# Patient Record
Sex: Female | Born: 1940 | Race: White | Hispanic: No | Smoking: Never smoker
Health system: Southern US, Community
[De-identification: ages and names within clinical notes are randomized; demographics above are authoritative.]

## PROBLEM LIST (undated history)

## (undated) DIAGNOSIS — E785 Hyperlipidemia, unspecified: Secondary | ICD-10-CM

## (undated) DIAGNOSIS — Z86718 Personal history of other venous thrombosis and embolism: Secondary | ICD-10-CM

## (undated) DIAGNOSIS — I10 Essential (primary) hypertension: Secondary | ICD-10-CM

## (undated) DIAGNOSIS — K5090 Crohn's disease, unspecified, without complications: Secondary | ICD-10-CM

## (undated) DIAGNOSIS — J45909 Unspecified asthma, uncomplicated: Secondary | ICD-10-CM

## (undated) HISTORY — DX: Personal history of other venous thrombosis and embolism: Z86.718

## (undated) HISTORY — DX: Crohn's disease, unspecified, without complications: K50.90

## (undated) HISTORY — PX: APPENDECTOMY: SHX54

## (undated) HISTORY — PX: TONSILLECTOMY: SUR1361

## (undated) HISTORY — DX: Hyperlipidemia, unspecified: E78.5

## (undated) HISTORY — DX: Essential (primary) hypertension: I10

## (undated) HISTORY — DX: Unspecified asthma, uncomplicated: J45.909

## (undated) HISTORY — PX: BREAST CYST ASPIRATION: SHX578

---

## 2008-10-31 DIAGNOSIS — Z86718 Personal history of other venous thrombosis and embolism: Secondary | ICD-10-CM

## 2008-10-31 HISTORY — DX: Personal history of other venous thrombosis and embolism: Z86.718

## 2008-11-11 ENCOUNTER — Encounter: Admission: RE | Admit: 2008-11-11 | Discharge: 2008-11-11 | Payer: Self-pay | Admitting: Family Medicine

## 2009-11-26 ENCOUNTER — Encounter: Admission: RE | Admit: 2009-11-26 | Discharge: 2009-11-26 | Payer: Self-pay | Admitting: Family Medicine

## 2010-05-31 HISTORY — PX: TRANSTHORACIC ECHOCARDIOGRAM: SHX275

## 2010-11-21 ENCOUNTER — Encounter: Payer: Self-pay | Admitting: Family Medicine

## 2010-12-02 ENCOUNTER — Other Ambulatory Visit: Payer: Self-pay | Admitting: Family Medicine

## 2010-12-02 DIAGNOSIS — Z1239 Encounter for other screening for malignant neoplasm of breast: Secondary | ICD-10-CM

## 2010-12-20 ENCOUNTER — Ambulatory Visit
Admission: RE | Admit: 2010-12-20 | Discharge: 2010-12-20 | Disposition: A | Discharge status: Home / Self Care | Payer: Medicare Other | Source: Ambulatory Visit | Attending: Family Medicine | Admitting: Family Medicine

## 2010-12-20 DIAGNOSIS — Z1239 Encounter for other screening for malignant neoplasm of breast: Secondary | ICD-10-CM

## 2011-11-16 ENCOUNTER — Other Ambulatory Visit: Payer: Self-pay | Admitting: Family Medicine

## 2011-11-16 DIAGNOSIS — Z1231 Encounter for screening mammogram for malignant neoplasm of breast: Secondary | ICD-10-CM

## 2011-12-26 ENCOUNTER — Ambulatory Visit
Admission: RE | Admit: 2011-12-26 | Discharge: 2011-12-26 | Disposition: A | Discharge status: Home / Self Care | Payer: Medicare Other | Source: Ambulatory Visit | Attending: Family Medicine | Admitting: Family Medicine

## 2011-12-26 DIAGNOSIS — Z1231 Encounter for screening mammogram for malignant neoplasm of breast: Secondary | ICD-10-CM

## 2012-11-19 ENCOUNTER — Other Ambulatory Visit: Payer: Self-pay | Admitting: Family Medicine

## 2012-11-19 DIAGNOSIS — Z1231 Encounter for screening mammogram for malignant neoplasm of breast: Secondary | ICD-10-CM

## 2012-12-31 ENCOUNTER — Ambulatory Visit: Payer: Medicare Other

## 2013-01-28 ENCOUNTER — Ambulatory Visit
Admission: RE | Admit: 2013-01-28 | Discharge: 2013-01-28 | Disposition: A | Discharge status: Home / Self Care | Payer: Medicare Other | Source: Ambulatory Visit | Attending: Family Medicine | Admitting: Family Medicine

## 2013-01-28 DIAGNOSIS — Z1231 Encounter for screening mammogram for malignant neoplasm of breast: Secondary | ICD-10-CM

## 2013-09-13 ENCOUNTER — Telehealth: Payer: Self-pay | Admitting: Internal Medicine

## 2013-09-13 NOTE — Telephone Encounter (Signed)
No orders for pt or recent OV noted.  Laura Duffy in Scheduling notified and will call pt to see if she saw PCP or other MD who may have ordered monitor.

## 2013-09-13 NOTE — Telephone Encounter (Signed)
Please call-question about why she needs a monitor.

## 2013-10-02 ENCOUNTER — Encounter: Payer: Self-pay | Admitting: *Deleted

## 2013-10-04 ENCOUNTER — Ambulatory Visit (INDEPENDENT_AMBULATORY_CARE_PROVIDER_SITE_OTHER): Payer: Medicare Other | Admitting: Internal Medicine

## 2013-10-04 ENCOUNTER — Encounter: Payer: Self-pay | Admitting: Internal Medicine

## 2013-10-04 VITALS — BP 128/70 | HR 88 | Ht 62.0 in | Wt 160.5 lb

## 2013-10-04 DIAGNOSIS — I10 Essential (primary) hypertension: Secondary | ICD-10-CM

## 2013-10-04 DIAGNOSIS — K5010 Crohn's disease of large intestine without complications: Secondary | ICD-10-CM

## 2013-10-04 DIAGNOSIS — Z86718 Personal history of other venous thrombosis and embolism: Secondary | ICD-10-CM | POA: Insufficient documentation

## 2013-10-04 DIAGNOSIS — E785 Hyperlipidemia, unspecified: Secondary | ICD-10-CM | POA: Insufficient documentation

## 2013-10-04 NOTE — Progress Notes (Signed)
OFFICE NOTE  Chief Complaint:  No complaints  Primary Care Physician: Almedia Balls, MD  HPI:  Laura Duffy is a 72 year old female previously followed by Dr. Lynnea Ferrier with a history of DVT in 2010. She was on Coumadin for a while, however, since has been taken off of that. She also has a history of Crohn disease and dyslipidemia. Recently we performed another ultrasound of her legs which showed resolution of the DVT in the right lower extremity. With regards to her cholesterol, she had a lipid profile performed by me with NMR which showed a total particle number of 2064, total cholesterol of 206, triglycerides 237, HDL 55, and LDL 104. I recommended increasing her Crestor to 40 mg daily and she did that for a few weeks but noted some muscle pains and aches and then decreased it back to 20 mg. She has worked on also changing her diet, and she has had some improvement in her cholesterol profile. Today told cholesterol is now 192, triglycerides 219, LDL of 100, HDL of 48. However, her calculated LDL by NMR was 77 with an LDL particle number of 1531, down from about 2000. This indicates significant progress as well as reduction in her risk of coronary events. Her previous LPIR score, which is a global calculation of risk based on lipoprotein profile, was 83 and is currently 78.  Today she has no specific complaints. She's been doing a little less exercise than she had previously and has had about 4-5 pound weight gain. Images to see how this has affected her cholesterol profile. Denies any chest pain or worsening shortness of breath with exertion. She reports no flares of her Crohn's colitis.   PMHx:  Past Medical History  Diagnosis Date  . History of DVT (deep vein thrombosis) 2010  . Crohn's disease   . Dyslipidemia   . Hypertension     Past Surgical History  Procedure Laterality Date  . Appendectomy    . Tonsillectomy    . Transthoracic echocardiogram  05/2010    EF=>55%; normal RVSP; mild  mitral annular calcif, trace MR; trace TR; AV mildly sclerotic    FAMHx:  No family history on file.  SOCHx:   reports that she has never smoked. She has never used smokeless tobacco. She reports that she does not drink alcohol or use illicit drugs.  ALLERGIES:  Allergies  Allergen Reactions  . Lisinopril Cough  . Penicillins     ROS: A comprehensive review of systems was negative.  HOME MEDS: Current Outpatient Prescriptions  Medication Sig Dispense Refill  . amLODipine (NORVASC) 10 MG tablet Take 10 mg by mouth daily.      Marland Kitchen aspirin 81 MG tablet Take 81 mg by mouth daily.      . balsalazide (COLAZAL) 750 MG capsule Take 3,000 mg by mouth 2 (two) times daily.       . Calcium Carbonate-Vit D-Min (CALCIUM 1200 PO) Take 1 tablet by mouth daily.      . cetirizine (ZYRTEC) 10 MG tablet Take 10 mg by mouth daily.      . Coenzyme Q10 (CO Q 10) 100 MG CAPS Take 1 capsule by mouth daily.      Marland Kitchen losartan-hydrochlorothiazide (HYZAAR) 100-25 MG per tablet Take 1 tablet by mouth daily.      . meloxicam (MOBIC) 15 MG tablet Take 1 tablet by mouth daily as needed.      . metoprolol succinate (TOPROL-XL) 100 MG 24 hr tablet Take 1 tablet by  mouth daily.      . Misc Natural Products (OSTEO BI-FLEX ADV DOUBLE ST) CAPS Take 1 capsule by mouth 2 (two) times daily.      . Multiple Vitamin (MULTIVITAMIN) capsule Take 1 capsule by mouth daily.      . nebivolol (BYSTOLIC) 10 MG tablet Take 10 mg by mouth daily.      . Niacin (VITAMIN B-3 PO) Take 1,000 mg by mouth daily.      Maxwell Caul Bicarbonate (ZEGERID PO) Take by mouth daily.      . psyllium (METAMUCIL) 58.6 % packet Take 4 packets by mouth daily.      . rosuvastatin (CRESTOR) 20 MG tablet Take 20 mg by mouth daily.      . vitamin B-12 (CYANOCOBALAMIN) 1000 MCG tablet Take 1,000 mcg by mouth daily.      . vitamin C (ASCORBIC ACID) 500 MG tablet Take 1,000 mg by mouth daily.       No current facility-administered medications for this  visit.    LABS/IMAGING: No results found for this or any previous visit (from the past 48 hour(s)). No results found.  VITALS: BP 128/70  Pulse 88  Ht 5\' 2"  (1.575 m)  Wt 160 lb 8 oz (72.802 kg)  BMI 29.35 kg/m2  EXAM: General appearance: alert and no distress Neck: no carotid bruit and no JVD Lungs: clear to auscultation bilaterally Heart: regular rate and rhythm, S1, S2 normal, no murmur, click, rub or gallop Abdomen: soft, non-tender; bowel sounds normal; no masses,  no organomegaly Extremities: extremities normal, atraumatic, no cyanosis or edema Pulses: 2+ and symmetric Skin: Skin color, texture, turgor normal. No rashes or lesions Neurologic: Grossly normal Psych: Mood, affect normal  EKG: Normal sinus rhythm at 88  ASSESSMENT: 1. Hypertension-well controlled 2. Dyslipidemia-controlled 3. Crohn's colitis 4. History of DVT-no recurrence on aspirin  PLAN: 1.   Laura Duffy is doing well on her current medical regimen. Her hypertension is well controlled. She is due for recheck of her lipid profile and we'll go ahead and obtain a lipid and MR. She reports no flares with her Crohn's disease. She's had no recurrence of DVT. Plan to see her back annually or sooner as necessary.  Chrystie Nose, MD, [facility] Attending Cardiologist [facility]  HILTY,Kenneth C 10/04/2013, 1:41 PM

## 2013-10-04 NOTE — Patient Instructions (Signed)
Your physician recommends that you return for lab work in a few days to a week.  You will need to be fasting - Dr. Rennis Golden wants to check your cholesterol levels.  Your physician wants you to follow-up in: 1 year. You will receive a reminder letter in the mail two months in advance. If you don't receive a letter, please call our office to schedule the follow-up appointment.

## 2013-11-11 LAB — NMR LIPOPROFILE WITH LIPIDS
Cholesterol, Total: 221 mg/dL — ABNORMAL HIGH (ref <200)
HDL Particle Number: 41.7 umol/L (ref >=30.5)
HDL Size: 8.4 nm — ABNORMAL LOW (ref >=9.2)
HDL-C: 53 mg/dL (ref >=40)
LDL (calc): 104 mg/dL — ABNORMAL HIGH (ref <100)
LDL Particle Number: 1925 nmol/L — ABNORMAL HIGH (ref <1000)
LDL Size: 19.6 nm — ABNORMAL LOW (ref >20.5)
LP-IR Score: 87 — ABNORMAL HIGH (ref <=45)
Large HDL-P: 2.6 umol/L — ABNORMAL LOW (ref >=4.8)
Large VLDL-P: 15.3 nmol/L — ABNORMAL HIGH (ref <=2.7)
Small LDL Particle Number: 1521 nmol/L — ABNORMAL HIGH (ref <=527)
Triglycerides: 319 mg/dL — ABNORMAL HIGH (ref <150)
VLDL Size: 54.3 nm — ABNORMAL HIGH (ref <=46.6)

## 2013-11-14 ENCOUNTER — Telehealth: Payer: Self-pay | Admitting: *Deleted

## 2013-11-14 DIAGNOSIS — E785 Hyperlipidemia, unspecified: Secondary | ICD-10-CM

## 2013-11-14 MED ORDER — ROSUVASTATIN CALCIUM 20 MG PO TABS: 30.0000 mg | ORAL_TABLET | Freq: Every day | ORAL | Status: DC

## 2013-11-14 NOTE — Telephone Encounter (Signed)
Ordered 30mg  of crestor. Repeat NMR ordered for 3 months - mailed to patient with instructions.

## 2013-11-14 NOTE — Telephone Encounter (Signed)
Message copied by Lindell SparELKINS, [name] M on Thu Nov 14, 2013 11:43 AM ------      Message from: Chrystie NoseHILTY, KENNETH C      Created: Tue Nov 12, 2013  6:25 PM       Cholesterol remains too high - would like LDL <100 and particle number closer to 1000.  Can you advise her to increase Crestor to 40 mg daily?            Thanks.            -Dr. Rennis GoldenHilty ------

## 2013-11-19 ENCOUNTER — Other Ambulatory Visit: Payer: Self-pay | Admitting: *Deleted

## 2013-11-19 NOTE — Telephone Encounter (Signed)
Insurance will not cover 45 tabs in 30 day period for patient to take 30mg  daily. Consulted with Dr. Rennis GoldenHilty and will inquire of patient to see if she will take 40mg  alternating with 20mg  daily to average 30mg . Left message on voicemail for patient ot return call.

## 2013-11-20 ENCOUNTER — Telehealth: Payer: Self-pay | Admitting: Internal Medicine

## 2013-11-20 NOTE — Telephone Encounter (Signed)
Returning your call from yesterday. She says she will try to call you sometime tomorrow.

## 2013-11-21 MED ORDER — ROSUVASTATIN CALCIUM 40 MG PO TABS: ORAL_TABLET | ORAL | Status: DC

## 2013-11-21 NOTE — Telephone Encounter (Signed)
Left VM with info regarding crestor PA. Asked patient to return call

## 2013-11-21 NOTE — Telephone Encounter (Signed)
Please leave a message on what you need if she is not there,will be in and out all day.

## 2013-11-21 NOTE — Telephone Encounter (Signed)
Called patient with information regarding crestor coverage. Patient agreed to take 40mg  crestor alternating with 20mg  crestor every other day, as insurance will not cover 20mg  tabs (take 1.5 daily, to equal 30mg  daily). crestor 40mg  ordered with instructions.

## 2013-12-30 ENCOUNTER — Other Ambulatory Visit: Payer: Self-pay

## 2013-12-30 DIAGNOSIS — Z1231 Encounter for screening mammogram for malignant neoplasm of breast: Secondary | ICD-10-CM

## 2014-01-31 ENCOUNTER — Ambulatory Visit
Admission: RE | Admit: 2014-01-31 | Discharge: 2014-01-31 | Disposition: A | Discharge status: Home / Self Care | Payer: Medicare Other | Source: Ambulatory Visit

## 2014-01-31 DIAGNOSIS — Z1231 Encounter for screening mammogram for malignant neoplasm of breast: Secondary | ICD-10-CM

## 2014-03-07 LAB — NMR LIPOPROFILE WITH LIPIDS
CHOLESTEROL, TOTAL: 179 mg/dL (ref <200)
HDL PARTICLE NUMBER: 32.8 umol/L (ref >=30.5)
HDL Size: 8.4 nm — ABNORMAL LOW (ref >=9.2)
HDL-C: 41 mg/dL (ref >=40)
LDL (calc): 76 mg/dL (ref <100)
LDL Particle Number: 1610 nmol/L — ABNORMAL HIGH (ref <1000)
LDL SIZE: 19.5 nm — AB (ref >20.5)
LP-IR Score: 79 — ABNORMAL HIGH (ref <=45)
Large VLDL-P: 6 nmol/L — ABNORMAL HIGH (ref <=2.7)
SMALL LDL PARTICLE NUMBER: 1248 nmol/L — AB (ref <=527)
Triglycerides: 310 mg/dL — ABNORMAL HIGH (ref <150)
VLDL Size: 49 nm — ABNORMAL HIGH (ref <=46.6)

## 2014-03-07 NOTE — Telephone Encounter (Signed)
LMTCB

## 2014-03-11 ENCOUNTER — Telehealth: Payer: Self-pay | Admitting: Internal Medicine

## 2014-03-11 DIAGNOSIS — E785 Hyperlipidemia, unspecified: Secondary | ICD-10-CM

## 2014-03-11 MED ORDER — FENOFIBRATE 145 MG PO TABS
145.0000 mg | ORAL_TABLET | Freq: Every day | ORAL | Status: DC
Start: 2014-03-11 — End: 2014-10-22

## 2014-03-11 NOTE — Telephone Encounter (Signed)
Patient's call returned. Informed her of lab results. Instructed to start new medication and repeat labs in 3 months. Voiced understanding.

## 2014-03-11 NOTE — Telephone Encounter (Signed)
Returning your call. °

## 2014-05-20 ENCOUNTER — Ambulatory Visit: Payer: Medicare Other | Admitting: Podiatry

## 2014-05-20 ENCOUNTER — Encounter: Payer: Self-pay | Admitting: Podiatry

## 2014-05-20 VITALS — BP 123/64 | HR 69 | Resp 16 | Ht 62.0 in | Wt 147.0 lb

## 2014-05-20 DIAGNOSIS — L600 Ingrowing nail: Secondary | ICD-10-CM

## 2014-05-20 MED ORDER — NEOMYCIN-POLYMYXIN-HC 3.5-10000-1 OT SOLN: OTIC | Status: DC

## 2014-05-20 NOTE — Patient Instructions (Signed)

## 2014-05-20 NOTE — Progress Notes (Signed)
   Subjective:    Patient ID: Laura Duffy, female    DOB: 11/25/1940, 73 y.o.   MRN: [id number]  HPI Comments: "I have ingrown toenails"  Patient c/o tenderness 1st toes bilateral, both borders on each toe, for about 6 months. She has been trying to keep trimmed but unable to do a good job. The toenails are thick and discolored as well.     Review of Systems  HENT: Positive for hearing loss, sinus pressure and tinnitus.   Musculoskeletal: Positive for arthralgias and back pain.  Allergic/Immunologic: Positive for environmental allergies.  All other systems reviewed and are negative.      Objective:   Physical Exam: I have reviewed her past medical history medications allergies surgeries social history and review of systems. Ulcers are strongly palpable bilateral. Neurologic sensorium is intact per Semmes-Weinstein monofilament. Deep tendon reflexes are intact bilateral. Muscle strength is 5 over 5 dorsiflexors plantar flexors inverters everters all intrinsic musculature is intact. Orthopedic evaluation demonstrates mild hallux valgus deformities bilateral with mild hammertoe deformities bilateral brace symptomatic. Cutaneous evaluation demonstrates sharp incurvated nail margins along the tibial and fibular border of the hallux bilateral.        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  Plan: Discussed etiology pathology conservative versus surgical therapies. At this point matrixectomy's were performed along the tibial and fibular border of each hallux. This is performed after local anesthetic was administered. She tolerated procedure well was given both oral and written home-going instructions for the care of of these toes. I will followup with her in one week.

## 2014-05-27 ENCOUNTER — Encounter: Payer: Self-pay | Admitting: Podiatry

## 2014-05-27 ENCOUNTER — Ambulatory Visit (INDEPENDENT_AMBULATORY_CARE_PROVIDER_SITE_OTHER): Payer: Medicare Other | Admitting: Podiatry

## 2014-05-27 DIAGNOSIS — L600 Ingrowing nail: Secondary | ICD-10-CM

## 2014-05-27 NOTE — Progress Notes (Signed)
She presents today one week status post matrixectomy hallux bilateral. She denies any pain fever chills nausea vomiting. Continues to soak twice daily and Betadine water apply Cortisporin as directed.  Objective: Vital signs are stable she is alert and oriented x3. Pulses are palpable bilateral. No erythema edema cellulitis drainage or odor to the bilateral tibial and fibular matrixectomy to the hallux.  Assessment: Well-healing surgical toes hallux bilateral.  Plan: Discontinue Betadine start with Epsom salts in warm water soaks covered in the day and leave open at night.

## 2014-07-02 LAB — NMR LIPOPROFILE WITH LIPIDS
Cholesterol, Total: 191 mg/dL (ref 100–199)
HDL PARTICLE NUMBER: 45.4 umol/L (ref >=30.5)
HDL SIZE: 8.3 nm — AB (ref >=9.2)
HDL-C: 57 mg/dL (ref >39)
LDL CALC: 95 mg/dL (ref 0–99)
LDL Particle Number: 1470 nmol/L — ABNORMAL HIGH (ref <1000)
LDL SIZE: 20.6 nm (ref >=20.8)
LP-IR SCORE: 89 — AB (ref <=45)
Large HDL-P: 2 umol/L — ABNORMAL LOW (ref >=4.8)
Large VLDL-P: 9.1 nmol/L — ABNORMAL HIGH (ref <=2.7)
Small LDL Particle Number: 720 nmol/L — ABNORMAL HIGH (ref <=527)
Triglycerides: 197 mg/dL — ABNORMAL HIGH (ref 0–149)
VLDL Size: 56.5 nm — ABNORMAL HIGH (ref <=46.6)

## 2014-07-09 ENCOUNTER — Telehealth: Payer: Self-pay | Admitting: *Deleted

## 2014-07-09 DIAGNOSIS — E785 Hyperlipidemia, unspecified: Secondary | ICD-10-CM

## 2014-07-09 MED ORDER — EZETIMIBE 10 MG PO TABS
10.0000 mg | ORAL_TABLET | Freq: Every day | ORAL | Status: DC
Start: 2014-07-09 — End: 2015-12-10

## 2014-07-09 NOTE — Telephone Encounter (Signed)
Patient notified of lab results and is agreeable to trying zetia. Rx was sent to pharmacy electronically. Labs ordered and slips mailed to patient.

## 2014-07-09 NOTE — Telephone Encounter (Signed)
Message copied by Lindell Spar on Wed Jul 09, 2014  5:10 PM ------      Message from: Chrystie Nose      Created: Tue Jul 08, 2014  4:46 PM       Cholesterol is reasonable - could be better, but she could not tolerate lipitor 40 mg in the past. Would she take Zetia 10 mg in addition to her crestor?            Dr. Rexene Edison ------

## 2014-09-30 ENCOUNTER — Ambulatory Visit: Payer: Medicare Other | Admitting: Podiatry

## 2014-10-15 ENCOUNTER — Ambulatory Visit: Payer: Medicare Other | Admitting: Internal Medicine

## 2014-10-22 ENCOUNTER — Other Ambulatory Visit: Payer: Self-pay | Admitting: Internal Medicine

## 2014-10-23 NOTE — Telephone Encounter (Signed)
Rx refill sent to patient pharmacy   

## 2014-11-06 LAB — NMR LIPOPROFILE WITH LIPIDS
CHOLESTEROL, TOTAL: 200 mg/dL — AB (ref 100–199)
HDL Particle Number: 47.7 umol/L (ref >=30.5)
HDL Size: 8.3 nm — ABNORMAL LOW (ref >=9.2)
HDL-C: 55 mg/dL (ref >39)
LDL (calc): 102 mg/dL — ABNORMAL HIGH (ref 0–99)
LDL PARTICLE NUMBER: 1776 nmol/L — AB (ref <1000)
LDL Size: 20.7 nm (ref >=20.8)
LP-IR SCORE: 99 — AB (ref <=45)
Large HDL-P: <1.3 umol/L — ABNORMAL LOW (ref >=4.8)
Large VLDL-P: 11.4 nmol/L — ABNORMAL HIGH (ref <=2.7)
SMALL LDL PARTICLE NUMBER: 983 nmol/L — AB (ref <=527)
Triglycerides: 214 mg/dL — ABNORMAL HIGH (ref 0–149)
VLDL SIZE: 63.3 nm — AB (ref <=46.6)

## 2014-11-24 ENCOUNTER — Telehealth: Payer: Self-pay | Admitting: Internal Medicine

## 2014-11-24 ENCOUNTER — Ambulatory Visit: Payer: Medicare Other | Admitting: Internal Medicine

## 2014-11-24 NOTE — Telephone Encounter (Signed)
Close encounter 

## 2014-11-25 ENCOUNTER — Other Ambulatory Visit: Payer: Self-pay | Admitting: Internal Medicine

## 2014-11-25 NOTE — Telephone Encounter (Signed)
Rx(s) sent to pharmacy electronically. OV 12/09/14 

## 2014-12-09 ENCOUNTER — Ambulatory Visit (INDEPENDENT_AMBULATORY_CARE_PROVIDER_SITE_OTHER): Payer: Medicare Other | Admitting: Internal Medicine

## 2014-12-09 ENCOUNTER — Encounter: Payer: Self-pay | Admitting: Internal Medicine

## 2014-12-09 VITALS — BP 126/84 | HR 78 | Ht 62.5 in | Wt 155.9 lb

## 2014-12-09 DIAGNOSIS — I6523 Occlusion and stenosis of bilateral carotid arteries: Secondary | ICD-10-CM

## 2014-12-09 DIAGNOSIS — Z86718 Personal history of other venous thrombosis and embolism: Secondary | ICD-10-CM

## 2014-12-09 DIAGNOSIS — E785 Hyperlipidemia, unspecified: Secondary | ICD-10-CM

## 2014-12-09 DIAGNOSIS — I10 Essential (primary) hypertension: Secondary | ICD-10-CM

## 2014-12-09 DIAGNOSIS — I6529 Occlusion and stenosis of unspecified carotid artery: Secondary | ICD-10-CM | POA: Insufficient documentation

## 2014-12-09 NOTE — Patient Instructions (Signed)
Your physician wants you to follow-up in: 1 year with Dr. Hilty. You will receive a reminder letter in the mail two months in advance. If you don't receive a letter, please call our office to schedule the follow-up appointment.  

## 2014-12-09 NOTE — Progress Notes (Signed)
OFFICE NOTE  Chief Complaint:  No complaints  Primary Care Physician: Almedia Balls, MD  HPI:  Laura Duffy is a 74 year old female previously followed by Dr. Lynnea Ferrier with a history of DVT in 2010. She was on Coumadin for a while, however, since has been taken off of that. She also has a history of Crohn disease and dyslipidemia. Recently we performed another ultrasound of her legs which showed resolution of the DVT in the right lower extremity. With regards to her cholesterol, she had a lipid profile performed by me with NMR which showed a total particle number of 2064, total cholesterol of 206, triglycerides 237, HDL 55, and LDL 104. I recommended increasing her Crestor to 40 mg daily and she did that for a few weeks but noted some muscle pains and aches and then decreased it back to 20 mg. She has worked on also changing her diet, and she has had some improvement in her cholesterol profile. Today told cholesterol is now 192, triglycerides 219, LDL of 100, HDL of 48. However, her calculated LDL by NMR was 77 with an LDL particle number of 1531, down from about 2000. This indicates significant progress as well as reduction in her risk of coronary events. Her previous LPIR score, which is a global calculation of risk based on lipoprotein profile, was 83 and is currently 78.  Today she has no specific complaints. She's been doing a little less exercise than she had previously and has had about 4-5 pound weight gain. Images to see how this has affected her cholesterol profile. Denies any chest pain or worsening shortness of breath with exertion. She reports no flares of her Crohn's colitis.  I saw [name] back today. She remains asymptomatic. Recently she underwent lifeline screening testing shows mild bilateral carotid artery disease based on peak systolic velocities less than 161 cm/s. She also had a small amount of peripheral arterial disease with a decreased ABIs of 0.9 for the left and one on the  right. C reactive protein was elevated 1.3, which could be consistent with Crohn's. Other studies were normal. She is not known to have coronary disease however this is some evidence that she has peripheral vascular disease and would argue that she needs much more strict cholesterol control. Unfortunately she's been intolerant of any higher doses of cholesterol medication due to myalgias. She is currently on max tolerated dose of cholesterol medication.  PMHx:  Past Medical History  Diagnosis Date  . History of DVT (deep vein thrombosis) 2010  . Crohn's disease   . Dyslipidemia   . Hypertension   . Asthma   . Hyperlipidemia     Past Surgical History  Procedure Laterality Date  . Appendectomy    . Tonsillectomy    . Transthoracic echocardiogram  05/2010    EF=>55%; normal RVSP; mild mitral annular calcif, trace MR; trace TR; AV mildly sclerotic    FAMHx:  No family history on file.  SOCHx:   reports that she has never smoked. She has never used smokeless tobacco. She reports that she does not drink alcohol or use illicit drugs.  ALLERGIES:  Allergies  Allergen Reactions  . Lisinopril Cough  . Penicillins     ROS: A comprehensive review of systems was negative.  HOME MEDS: Current Outpatient Prescriptions  Medication Sig Dispense Refill  . amLODipine (NORVASC) 10 MG tablet Take 10 mg by mouth daily.    Marland Kitchen aspirin 81 MG tablet Take 81 mg by mouth daily.    Marland Kitchen  balsalazide (COLAZAL) 750 MG capsule Take 3,000 mg by mouth 2 (two) times daily.     . Calcium Carbonate-Vit D-Min (CALCIUM 1200 PO) Take 1 tablet by mouth daily.    . cetirizine (ZYRTEC) 10 MG tablet Take 10 mg by mouth daily.    . Coenzyme Q10 (CO Q 10) 100 MG CAPS Take 1 capsule by mouth daily.    Marland Kitchen. ezetimibe (ZETIA) 10 MG tablet Take 1 tablet (10 mg total) by mouth daily. 30 tablet 6  . fenofibrate (TRICOR) 145 MG tablet TAKE ONE TABLET BY MOUTH ONCE DAILY 30 tablet 0  . fluticasone (FLONASE) 50 MCG/ACT nasal spray  as needed.    . hydrochlorothiazide (HYDRODIURIL) 25 MG tablet Take 1 tablet by mouth daily.    Marland Kitchen. losartan (COZAAR) 50 MG tablet Take 1 tablet by mouth daily.    . metoprolol succinate (TOPROL-XL) 100 MG 24 hr tablet Take 1 tablet by mouth daily.    . Milk Thistle 250 MG CAPS Take by mouth.    . Misc Natural Products (OSTEO BI-FLEX ADV DOUBLE ST) CAPS Take 1 capsule by mouth 2 (two) times daily.    . Multiple Vitamin (MULTIVITAMIN) capsule Take 1 capsule by mouth daily.    Marland Kitchen. neomycin-polymyxin-hydrocortisone (CORTISPORIN) otic solution Apply to affected area twice daily after soaking. 10 mL 1  . Niacin (VITAMIN B-3 PO) Take 1,000 mg by mouth daily.    Maxwell Caul. Omeprazole-Sodium Bicarbonate (ZEGERID PO) Take by mouth daily.    . psyllium (METAMUCIL) 58.6 % packet Take 4 packets by mouth daily.    . rosuvastatin (CRESTOR) 20 MG tablet Take 20 mg by mouth daily.    . traMADol (ULTRAM) 50 MG tablet Take by mouth every 6 (six) hours as needed.    . TURMERIC PO Take by mouth.    . vitamin B-12 (CYANOCOBALAMIN) 1000 MCG tablet Take 1,000 mcg by mouth daily.    . vitamin C (ASCORBIC ACID) 500 MG tablet Take 1,000 mg by mouth daily.     No current facility-administered medications for this visit.    LABS/IMAGING: No results found for this or any previous visit (from the past 48 hour(s)). No results found.  VITALS: BP 126/84 mmHg  Pulse 78  Ht 5' 2.5" (1.588 m)  Wt 155 lb 14.4 oz (70.716 kg)  BMI 28.04 kg/m2  EXAM: General appearance: alert and no distress Neck: no carotid bruit and no JVD Lungs: clear to auscultation bilaterally Heart: regular rate and rhythm, S1, S2 normal, no murmur, click, rub or gallop Abdomen: soft, non-tender; bowel sounds normal; no masses,  no organomegaly Extremities: extremities normal, atraumatic, no cyanosis or edema Pulses: 2+ and symmetric Skin: Skin color, texture, turgor normal. No rashes or lesions Neurologic: Grossly normal Psych: Mood, affect  normal  EKG: Normal sinus rhythm at 78  ASSESSMENT: 1. Hypertension-well controlled 2. Dyslipidemia-not at goal, on max dose statin 3. Crohn's colitis 4. History of DVT-no recurrence on aspirin 5. Mild bilateral carotid artery disease  PLAN: 1.   Mrs. Kirk RuthsGoad is without complaints. She recently had screening carotid Dopplers which show mild bilateral carotid artery disease. Her cholesterol is not at goal LDL less than 70. She will not reach that goal due to being maxed out on her current dose of Crestor and onset EF. Increase his dose in the past have been associated with myalgias. She is also on fenofibrate. I think she be a good candidate for a PCSK9 inhibitor based and the fact she has demonstrated cardiovascular disease.  We'll go ahead and submit an application for prior authorization.  Chrystie Nose, MD, [facility] Attending Cardiologist [facility]  Camyla Camposano C 12/09/2014, 3:08 PM

## 2014-12-23 ENCOUNTER — Other Ambulatory Visit: Payer: Self-pay | Admitting: Internal Medicine

## 2014-12-24 NOTE — Telephone Encounter (Signed)
Rx(s) sent to pharmacy electronically.  

## 2014-12-25 ENCOUNTER — Telehealth: Payer: Self-pay | Admitting: Internal Medicine

## 2014-12-25 NOTE — Telephone Encounter (Signed)
Pt called in stating that she will receive her new medication tomorrow, she stated that it is an injection that she is not familiar with. She was instructed to let Dr. Blanchie DessertHilty's nurse know when the medication would be coming in. Please f/u  Thanks

## 2014-12-25 NOTE — Telephone Encounter (Signed)
Laura StanfordJenna, please call patient and see if she can come in on Monday afternoon for injection teaching on Repatha/Praluent.  I have a few openings then.  Have her bring the injection with her.

## 2014-12-25 NOTE — Telephone Encounter (Signed)
Patient will come see Belenda CruiseKristin 2/29 @ 3pm for lipid clinic/PCS-K9 education. Patient aware and will bring injectables with her

## 2014-12-29 ENCOUNTER — Ambulatory Visit (INDEPENDENT_AMBULATORY_CARE_PROVIDER_SITE_OTHER): Payer: Medicare Other | Admitting: Pharmacist Clinician (PhC)/ Clinical Pharmacy Specialist

## 2014-12-29 DIAGNOSIS — I6523 Occlusion and stenosis of bilateral carotid arteries: Secondary | ICD-10-CM

## 2014-12-29 DIAGNOSIS — E785 Hyperlipidemia, unspecified: Secondary | ICD-10-CM

## 2014-12-30 ENCOUNTER — Other Ambulatory Visit: Payer: Self-pay

## 2014-12-30 ENCOUNTER — Encounter: Payer: Self-pay | Admitting: Pharmacist Clinician (PhC)/ Clinical Pharmacy Specialist

## 2014-12-30 DIAGNOSIS — Z1231 Encounter for screening mammogram for malignant neoplasm of breast: Secondary | ICD-10-CM

## 2014-12-30 NOTE — Progress Notes (Signed)
Pt enrolled in Patient Start Program for Praluent.  Received first injections several days ago.  Brought to office today for injection training.  Explained storage and use of injections. Patient practiced with teaching pen, then injected herself without incident.

## 2015-02-03 ENCOUNTER — Ambulatory Visit (INDEPENDENT_AMBULATORY_CARE_PROVIDER_SITE_OTHER): Payer: Medicare Other | Admitting: Podiatry

## 2015-02-03 ENCOUNTER — Encounter: Payer: Self-pay | Admitting: Podiatry

## 2015-02-03 VITALS — Resp 11

## 2015-02-03 DIAGNOSIS — I6523 Occlusion and stenosis of bilateral carotid arteries: Secondary | ICD-10-CM

## 2015-02-03 DIAGNOSIS — L600 Ingrowing nail: Secondary | ICD-10-CM

## 2015-02-03 MED ORDER — NEOMYCIN-POLYMYXIN-HC 3.5-10000-1 OT SOLN: OTIC | Status: DC

## 2015-02-03 NOTE — Patient Instructions (Addendum)

## 2015-02-03 NOTE — Progress Notes (Signed)
She presents today with a chief complaint of painful hallux nails bilaterally. Matrixectomy's were performed previously the resulted in abnormal nail growth which are painful to her with shoe gear. She'll like to have these completely removed at all possible.  Objective: Vital signs are stable she is alert and oriented 3. Pulses are palpable bilateral. Neurologic sensorium is intact. Nail place of the hallux demonstrate irregular nail growth which are painful on palpation and debridement.  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  Plan: Chemical matrixectomy to the hallux bilateral. This was performed after local anesthetic was achieved. She tolerated procedure well. Applications of phenol were applied neutralized with isopropyl alcohol and the Silvadene cream and Telfa pad dry sterile compressive dressing was applied. She will start soaking in Betadine and water starting tomorrow and I will follow-up with her in 1 week to assure that she is healing well.

## 2015-02-10 ENCOUNTER — Ambulatory Visit (INDEPENDENT_AMBULATORY_CARE_PROVIDER_SITE_OTHER): Payer: Medicare Other | Admitting: Podiatry

## 2015-02-10 ENCOUNTER — Ambulatory Visit: Payer: Medicare Other

## 2015-02-10 ENCOUNTER — Encounter: Payer: Self-pay | Admitting: Podiatry

## 2015-02-10 VITALS — BP 133/76 | HR 80 | Resp 12

## 2015-02-10 DIAGNOSIS — L600 Ingrowing nail: Secondary | ICD-10-CM

## 2015-02-10 NOTE — Progress Notes (Signed)
She presents today for that follow-up of matrixectomy hallux bilateral. She states that she's been soaking and they're doing well.  Objective: Vital signs are stable she's alert and oriented 3. Mildly erythematous with a granular base and epithelialization to the nailbeds hallux bilateral. I see signs of bacterial infection.  Assessment: Well-healing matrixectomy hallux bilateral.  Plan: Discontinue Betadine [place] with Epsom salts [place] so severe they leave open at night.

## 2015-02-10 NOTE — Patient Instructions (Signed)

## 2015-02-26 ENCOUNTER — Ambulatory Visit
Admission: RE | Admit: 2015-02-26 | Discharge: 2015-02-26 | Disposition: A | Discharge status: Home / Self Care | Payer: Medicare Other | Source: Ambulatory Visit

## 2015-02-26 DIAGNOSIS — Z1231 Encounter for screening mammogram for malignant neoplasm of breast: Secondary | ICD-10-CM

## 2015-04-01 LAB — LIPID PANEL
Cholesterol: 156 mg/dL (ref 0–200)
HDL: 50 mg/dL (ref >=46)
LDL Cholesterol: 65 mg/dL (ref 0–99)
TRIGLYCERIDES: 203 mg/dL — AB (ref <150)
Total CHOL/HDL Ratio: 3.1 Ratio
VLDL: 41 mg/dL — ABNORMAL HIGH (ref 0–40)

## 2015-04-01 LAB — HEPATIC FUNCTION PANEL
ALT: 9 U/L (ref 0–35)
AST: 14 U/L (ref 0–37)
Albumin: 4.1 g/dL (ref 3.5–5.2)
Alkaline Phosphatase: 38 U/L — ABNORMAL LOW (ref 39–117)
Bilirubin, Direct: 0.1 mg/dL (ref 0.0–0.3)
Indirect Bilirubin: 0.3 mg/dL (ref 0.2–1.2)
Total Bilirubin: 0.4 mg/dL (ref 0.2–1.2)
Total Protein: 6.5 g/dL (ref 6.0–8.3)

## 2015-12-10 ENCOUNTER — Encounter: Payer: Self-pay | Admitting: Internal Medicine

## 2015-12-10 ENCOUNTER — Ambulatory Visit (INDEPENDENT_AMBULATORY_CARE_PROVIDER_SITE_OTHER): Payer: Medicare Other | Admitting: Internal Medicine

## 2015-12-10 VITALS — BP 132/80 | HR 79 | Ht 62.0 in | Wt 160.8 lb

## 2015-12-10 DIAGNOSIS — E785 Hyperlipidemia, unspecified: Secondary | ICD-10-CM | POA: Diagnosis not present

## 2015-12-10 DIAGNOSIS — I6529 Occlusion and stenosis of unspecified carotid artery: Secondary | ICD-10-CM | POA: Diagnosis not present

## 2015-12-10 DIAGNOSIS — I10 Essential (primary) hypertension: Secondary | ICD-10-CM

## 2015-12-10 NOTE — Progress Notes (Signed)
OFFICE NOTE  Chief Complaint:  No complaints  Primary Care Physician: Laura Balls, MD  HPI:  Laura Duffy is a 75 year old female previously followed by Dr. Lynnea Duffy with a history of DVT in 2010. She was on Coumadin for a while, however, since has been taken off of that. She also has a history of Crohn disease and dyslipidemia. Recently we performed another ultrasound of her legs which showed resolution of the DVT in the right lower extremity. With regards to her cholesterol, she had a lipid profile performed by me with NMR which showed a total particle number of 2064, total cholesterol of 206, triglycerides 237, HDL 55, and LDL 104. I recommended increasing her Crestor to 40 mg daily and she did that for a few weeks but noted some muscle pains and aches and then decreased it back to 20 mg. She has worked on also changing her diet, and she has had some improvement in her cholesterol profile. Today told cholesterol is now 192, triglycerides 219, LDL of 100, HDL of 48. However, her calculated LDL by NMR was 77 with an LDL particle number of 1531, down from about 2000. This indicates significant progress as well as reduction in her risk of coronary events. Her previous LPIR score, which is a global calculation of risk based on lipoprotein profile, was 83 and is currently 78.  Today she has no specific complaints. She's been doing a little less exercise than she had previously and has had about 4-5 pound weight gain. Images to see how this has affected her cholesterol profile. Denies any chest pain or worsening shortness of breath with exertion. She reports no flares of her Crohn's colitis.  I saw Ms. Laura Duffy back today. She remains asymptomatic. Recently she underwent lifeline screening testing shows mild bilateral carotid artery disease based on peak systolic velocities less than 409 cm/s. She also had a small amount of peripheral arterial disease with a decreased ABIs of 0.9 for the left and one on the  right. C reactive protein was elevated 1.3, which could be consistent with Crohn's. Other studies were normal. She is not known to have coronary disease however this is some evidence that she has peripheral vascular disease and would argue that she needs much more strict cholesterol control. Unfortunately she's been intolerant of any higher doses of cholesterol medication due to myalgias. She is currently on max tolerated dose of cholesterol medication.  Laura Duffy returns today for follow-up. She is without complaints. Blood pressure is well-controlled. She had recent cholesterol check in January by her primary care provider which we will request. She also had lifeline screening which she gets every year and that was stable according to her report. She will send [place] that information. She denies any chest pain or worsening shortness of breath.  PMHx:  Past Medical History  Diagnosis Date  . History of DVT (deep vein thrombosis) 2010  . Crohn's disease (HCC)   . Dyslipidemia   . Hypertension   . Asthma   . Hyperlipidemia     Past Surgical History  Procedure Laterality Date  . Appendectomy    . Tonsillectomy    . Transthoracic echocardiogram  05/2010    EF=>55%; normal RVSP; mild mitral annular calcif, trace MR; trace TR; AV mildly sclerotic    FAMHx:  Family History  Problem Relation Age of Onset  . Cancer Father     SOCHx:   reports that she has never smoked. She has never used smokeless tobacco. She reports  that she does not drink alcohol or use illicit drugs.  ALLERGIES:  Allergies  Allergen Reactions  . Lisinopril Cough  . Penicillins     ROS: A comprehensive review of systems was negative.  HOME MEDS: Current Outpatient Prescriptions  Medication Sig Dispense Refill  . amLODipine (NORVASC) 10 MG tablet Take 10 mg by mouth daily.    Marland Kitchen aspirin 81 MG tablet Take 81 mg by mouth daily.    . balsalazide (COLAZAL) 750 MG capsule Take 3,000 mg by mouth 2 (two) times daily.     .  Calcium Carbonate-Vit D-Min (CALCIUM 1200 PO) Take 1 tablet by mouth daily.    . cetirizine (ZYRTEC) 10 MG tablet Take 10 mg by mouth daily.    . Coenzyme Q10 (CO Q 10) 100 MG CAPS Take 1 capsule by mouth daily.    . fenofibrate (TRICOR) 145 MG tablet Take 1 tablet (145 mg total) by mouth daily. 30 tablet 11  . fluticasone (FLONASE) 50 MCG/ACT nasal spray as needed.    . hydrochlorothiazide (HYDRODIURIL) 25 MG tablet Take 1 tablet by mouth daily.    . Inulin (FIBER CHOICE PO) Take 4 capsules by mouth daily.    Marland Kitchen losartan (COZAAR) 50 MG tablet Take 1 tablet by mouth daily.    . metoprolol succinate (TOPROL-XL) 100 MG 24 hr tablet Take 1 tablet by mouth daily.    . Milk Thistle 250 MG CAPS Take by mouth.    . Multiple Vitamin (MULTIVITAMIN) capsule Take 1 capsule by mouth daily.    Marland Kitchen neomycin-polymyxin-hydrocortisone (CORTISPORIN) otic solution Apply one to two drops to toe after soaking twice daily. 10 mL 0  . Niacin (VITAMIN B-3 PO) Take 1,000 mg by mouth daily.    Maxwell Caul Bicarbonate (ZEGERID PO) Take by mouth daily.    . rosuvastatin (CRESTOR) 20 MG tablet Take 20 mg by mouth daily.    . traMADol (ULTRAM) 50 MG tablet Take by mouth every 6 (six) hours as needed.    . TURMERIC PO Take by mouth.    . vitamin B-12 (CYANOCOBALAMIN) 1000 MCG tablet Take 1,000 mcg by mouth daily.    . vitamin C (ASCORBIC ACID) 500 MG tablet Take 1,000 mg by mouth daily.     No current facility-administered medications for this visit.    LABS/IMAGING: No results found for this or any previous visit (from the past 48 hour(s)). No results found.  VITALS: BP 132/80 mmHg  Pulse 79  Ht 5\' 2"  (1.575 m)  Wt 160 lb 12.8 oz (72.938 kg)  BMI 29.40 kg/m2  EXAM: General appearance: alert and no distress Neck: no carotid bruit and no JVD Lungs: clear to auscultation bilaterally Heart: regular rate and rhythm, S1, S2 normal, no murmur, click, rub or gallop Abdomen: soft, non-tender; bowel sounds  normal; no masses,  no organomegaly Extremities: extremities normal, atraumatic, no cyanosis or edema Pulses: 2+ and symmetric Skin: Skin color, texture, turgor normal. No rashes or lesions Neurologic: Grossly normal Psych: Mood, affect normal  EKG: Normal sinus rhythm at 79  ASSESSMENT: 1. Hypertension-well controlled 2. Dyslipidemia-not at goal, on max dose statin 3. Crohn's colitis 4. History of DVT-no recurrence on aspirin 5. Mild bilateral carotid artery disease  PLAN: 1.   Mrs. Bagby is without complaints. While she does have mild peripheral arterial disease in the carotid and lower extremities, she has no documented cardiovascular disease or prior history of stent or bypass. This is precluded her from being a good candidate for the  PC SK 9 inhibitors. She was briefly on prevalent however did not qualify for insurance coverage. When she was on the medication she had significant reduction in cholesterol and did meet her goal. Unfortunately she's intolerant of increased dose of Crestor. We'll have to continue her current medications for now. Plan to see her back annually or sooner as necessary.  Chrystie Nose, MD, [facility] Attending Cardiologist [facility]  Chrystie Nose 12/10/2015, 9:34 AM

## 2015-12-10 NOTE — Patient Instructions (Signed)
Dr Hilty recommends that you schedule a follow-up appointment in 1 year. You will receive a reminder letter in the mail two months in advance. If you don't receive a letter, please call our office to schedule the follow-up appointment.  If you need a refill on your cardiac medications before your next appointment, please call your pharmacy. 

## 2016-01-20 ENCOUNTER — Other Ambulatory Visit: Payer: Self-pay

## 2016-01-20 DIAGNOSIS — Z1231 Encounter for screening mammogram for malignant neoplasm of breast: Secondary | ICD-10-CM

## 2016-02-29 ENCOUNTER — Ambulatory Visit
Admission: RE | Admit: 2016-02-29 | Discharge: 2016-02-29 | Disposition: A | Discharge status: Home / Self Care | Payer: Medicare Other | Source: Ambulatory Visit

## 2016-02-29 DIAGNOSIS — Z1231 Encounter for screening mammogram for malignant neoplasm of breast: Secondary | ICD-10-CM

## 2016-12-20 ENCOUNTER — Encounter: Payer: Self-pay | Admitting: Internal Medicine

## 2016-12-20 ENCOUNTER — Ambulatory Visit: Payer: Medicare Other | Admitting: Internal Medicine

## 2016-12-20 ENCOUNTER — Ambulatory Visit (INDEPENDENT_AMBULATORY_CARE_PROVIDER_SITE_OTHER): Payer: Medicare Other | Admitting: Internal Medicine

## 2016-12-20 VITALS — BP 154/82 | HR 68 | Ht 62.5 in | Wt 160.6 lb

## 2016-12-20 DIAGNOSIS — I10 Essential (primary) hypertension: Secondary | ICD-10-CM

## 2016-12-20 DIAGNOSIS — I872 Venous insufficiency (chronic) (peripheral): Secondary | ICD-10-CM

## 2016-12-20 DIAGNOSIS — E782 Mixed hyperlipidemia: Secondary | ICD-10-CM | POA: Diagnosis not present

## 2016-12-20 DIAGNOSIS — R600 Localized edema: Secondary | ICD-10-CM

## 2016-12-20 MED ORDER — LOSARTAN POTASSIUM 100 MG PO TABS: 100.0000 mg | ORAL_TABLET | Freq: Every day | ORAL | 3 refills | Status: DC

## 2016-12-20 MED ORDER — AMLODIPINE BESYLATE 5 MG PO TABS: 5.0000 mg | ORAL_TABLET | Freq: Every day | ORAL | 3 refills | Status: DC

## 2016-12-20 NOTE — Patient Instructions (Addendum)
Dr. Rennis Golden has recommended compression stockings -- bilateral -- knee high  -- 20-34mmHg (strength) -- put on in morning when legs have less swelling, wear during day, remove before bed  Your physician has recommended you make the following change in your medication:  -- DECREASE amlodipine to 5mg  daily -- INCREASE losartan to 100mg  daily  Your physician recommends that you schedule a follow-up appointment in: ONE MONTH with Dr. Rennis Golden     How to Use Compression Stockings Compression stockings are elastic socks that squeeze the legs. They help to increase blood flow to the legs, decrease swelling in the legs, and reduce the chance of developing blood clots in the lower legs. Compression stockings are often used by people who:  Are recovering from surgery.  Have poor circulation in their legs.  Are prone to getting blood clots in their legs.  Have varicose veins.  Sit or stay in bed for long periods of time. How to use compression stockings Before you put on your compression stockings:  Make sure that they are the correct size. If you do not know your size, ask your health care provider.  Make sure that they are clean, dry, and in good condition.  Check them for rips and tears. Do not put them on if they are ripped or torn. Put your stockings on first thing in the morning, before you get out of bed. Keep them on for as long as your health care provider advises. When you are wearing your stockings:  Keep them as smooth as possible. Do not allow them to bunch up. It is especially important to prevent the stockings from bunching up around your toes or behind your knees.  Do not roll the stockings downward and leave them rolled down. This can decrease blood flow to your leg.  Change them right away if they become wet or dirty. When you take off your stockings, inspect your legs and feet. Anything that does not seem normal may require medical attention. Look for:  Open  sores.  Red spots.  Swelling. Information and tips  Do not stop wearing your compression stockings without talking to your health care provider first.  Wash your stockings every day with mild detergent in cold or warm water. Do not use bleach. Air-dry your stockings or dry them in a clothes dryer on low heat.  Replace your stockings every 3-6 months.  If skin moisturizing is part of your treatment plan, apply lotion or cream at night so that your skin will be dry when you put on the stockings in the morning. It is harder to put the stockings on when you have lotion on your legs or feet. Contact a health care provider if: Remove your stockings and seek medical care if:  You have a feeling of pins and needles in your feet or legs.  You have any new changes in your skin.  You have skin lesions that are getting worse.  You have swelling or pain that is getting worse. Get help right away if:  You have numbness or tingling in your lower legs that does not get better right after you take the stockings off.  Your toes or feet become cold and blue.  You develop open sores or red spots on your legs that do not go away.  You see or feel a warm spot on your leg.  You have new swelling or soreness in your leg.  You are short of breath or you have chest pain for  no reason.  You have a rapid or irregular heartbeat.  You feel light-headed or dizzy. This information is not intended to replace advice given to you by your health care provider. Make sure you discuss any questions you have with your health care provider. Document Released: 08/14/2009 Document Revised: 03/16/2016 Document Reviewed: 09/24/2014 Elsevier Interactive Patient Education  2017 [employer]

## 2016-12-20 NOTE — Progress Notes (Signed)
OFFICE NOTE  Chief Complaint:  Leg swelling  Primary Care Physician: Almedia BallsKELLY,SAM, MD  HPI:  Laura Duffy is a 76 year old female previously followed by Dr. Lynnea Duffy with a history of DVT in 2010. She was on Coumadin for a while, however, since has been taken off of that. She also has a history of Crohn disease and dyslipidemia. Recently we performed another ultrasound of her legs which showed resolution of the DVT in the right lower extremity. With regards to her cholesterol, she had a lipid profile performed by me with NMR which showed a total particle number of 2064, total cholesterol of 206, triglycerides 237, HDL 55, and LDL 104. I recommended increasing her Crestor to 40 mg daily and she did that for a few weeks but noted some muscle pains and aches and then decreased it back to 20 mg. She has worked on also changing her diet, and she has had some improvement in her cholesterol profile. Today told cholesterol is now 192, triglycerides 219, LDL of 100, HDL of 48. However, her calculated LDL by NMR was 77 with an LDL particle number of 1531, down from about 2000. This indicates significant progress as well as reduction in her risk of coronary events. Her previous LPIR score, which is a global calculation of risk based on lipoprotein profile, was 83 and is currently 78.  Today she has no specific complaints. She's been doing a little less exercise than she had previously and has had about 4-5 pound weight gain. Images to see how this has affected her cholesterol profile. Denies any chest pain or worsening shortness of breath with exertion. She reports no flares of her Crohn's colitis.  I saw Ms. Laura Duffy back today. She remains asymptomatic. Recently she underwent lifeline screening testing shows mild bilateral carotid artery disease based on peak systolic velocities less than 528110 cm/s. She also had a small amount of peripheral arterial disease with a decreased ABIs of 0.9 for the left and one on the  right. C reactive protein was elevated 1.3, which could be consistent with Crohn's. Other studies were normal. She is not known to have coronary disease however this is some evidence that she has peripheral vascular disease and would argue that she needs much more strict cholesterol control. Unfortunately she's been intolerant of any higher doses of cholesterol medication due to myalgias. She is currently on max tolerated dose of cholesterol medication.  Laura Duffy returns today for follow-up. She is without complaints. Blood pressure is well-controlled. She had recent cholesterol check in January by her primary care provider which we will request. She also had lifeline screening which she gets every year and that was stable according to her report. She will send us that information. She denies any chest pain or worsening shortness of breath.  12/20/2016  Laura Duffy was seen back today in follow-up. She has no new complaints except she does have some lower extremity swelling. This is been an issue recently. She does have more swelling in the morning which gets better at night when she elevates her feet. She is on amlodipine 10 mg daily for blood pressure control. She also takes low-dose hydrochlorothiazide. 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  PMHx:  Past Medical History:  Diagnosis Date  . Asthma   . Crohn's disease (HCC)   . Dyslipidemia   . History of DVT (deep vein thrombosis) 2010  . Hyperlipidemia   . Hypertension  Past Surgical History:  Procedure Laterality Date  . APPENDECTOMY    . TONSILLECTOMY    . TRANSTHORACIC ECHOCARDIOGRAM  05/2010   EF=>55%; normal RVSP; mild mitral annular calcif, trace MR; trace TR; AV mildly sclerotic    FAMHx:  Family History  Problem Relation Age of Onset  . Cancer Father     SOCHx:   reports that she has never smoked. She has never used smokeless tobacco. She reports that she does not drink  alcohol or use drugs.  ALLERGIES:  Allergies  Allergen Reactions  . Lisinopril Cough  . Penicillins     ROS: A comprehensive review of systems was negative.  HOME MEDS: Current Outpatient Prescriptions  Medication Sig Dispense Refill  . aspirin 81 MG tablet Take 81 mg by mouth daily.    . balsalazide (COLAZAL) 750 MG capsule Take 3,000 mg by mouth 2 (two) times daily.     . Calcium Carbonate-Vit D-Min (CALCIUM 1200 PO) Take 1 tablet by mouth daily.    . cetirizine (ZYRTEC) 10 MG tablet Take 10 mg by mouth daily.    . Coenzyme Q10 (CO Q 10) 100 MG CAPS Take 1 capsule by mouth daily.    . fenofibrate (TRICOR) 145 MG tablet Take 1 tablet (145 mg total) by mouth daily. 30 tablet 11  . fluticasone (FLONASE) 50 MCG/ACT nasal spray as needed.    . hydrochlorothiazide (HYDRODIURIL) 25 MG tablet Take 1 tablet by mouth daily.    . Inulin (FIBER CHOICE PO) Take 4 capsules by mouth daily.    . metoprolol succinate (TOPROL-XL) 100 MG 24 hr tablet Take 1 tablet by mouth daily.    . Milk Thistle 250 MG CAPS Take by mouth.    . Multiple Vitamin (MULTIVITAMIN) capsule Take 1 capsule by mouth daily.    Marland Kitchen neomycin-polymyxin-hydrocortisone (CORTISPORIN) otic solution Apply one to two drops to toe after soaking twice daily. 10 mL 0  . Niacin (VITAMIN B-3 PO) Take 1,000 mg by mouth daily.    Marland Kitchen omeprazole (PRILOSEC) 40 MG capsule Take 40 mg by mouth as directed.    . rosuvastatin (CRESTOR) 20 MG tablet Take 20 mg by mouth daily.    . traMADol (ULTRAM) 50 MG tablet Take by mouth every 6 (six) hours as needed.    . TURMERIC PO Take by mouth.    . vitamin B-12 (CYANOCOBALAMIN) 1000 MCG tablet Take 1,000 mcg by mouth daily.    . vitamin C (ASCORBIC ACID) 500 MG tablet Take 1,000 mg by mouth daily.    Marland Kitchen amLODipine (NORVASC) 5 MG tablet Take 1 tablet (5 mg total) by mouth daily. 90 tablet 3  . losartan (COZAAR) 100 MG tablet Take 1 tablet (100 mg total) by mouth daily. 90 tablet 3   No current  facility-administered medications for this visit.     LABS/IMAGING: No results found for this or any previous visit (from the past 48 hour(s)). No results found.  VITALS: BP (!) 154/82   Pulse 68   Ht 5' 2.5" (1.588 m)   Wt 160 lb 9.6 oz (72.8 kg)   BMI 28.91 kg/m   EXAM: General appearance: alert and no distress Neck: no carotid bruit and no JVD Lungs: clear to auscultation bilaterally Heart: regular rate and rhythm, S1, S2 normal, no murmur, click, rub or gallop Abdomen: soft, non-tender; bowel sounds normal; no masses,  no organomegaly Extremities: edema 1+ bilateral and venous stasis dermatitis noted Pulses: 2+ and symmetric Skin: bilateral LE hemosiderin deposition at  the ankles Neurologic: Grossly normal Psych: Mood, affect normal  EKG: Sinus rhythm with first-degree AV block at 68  ASSESSMENT: 1. Hypertension-well controlled 2. Dyslipidemia-not at goal, on max dose statin 3. Crohn's colitis 4. History of DVT-no recurrence on aspirin 5. Mild bilateral carotid artery disease 6. 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   Mrs. Rullo does appear to have some bilateral venous insufficiency and signs of possible post-thrombotic syndrome given her prior DVT. She reports discomfort including heaviness and achiness in her legs as well as swelling, consistent with bilateral CEAP I, II, III disease. I have recommended bilateral knee-high 20-30 mmHg compression stockings. We will decrease her amlodipine to 10 mg daily and subsequently increase her losartan to 100 mg daily to maintain blood pressure control. Hopefully this change will result in less edema. Plan follow-up in 4-6 weeks.  Chrystie Nose, MD, [facility] Attending Cardiologist [facility]  Lisette Abu Tiger Spieker 12/20/2016, 1:19 PM

## 2017-02-01 ENCOUNTER — Other Ambulatory Visit: Payer: Self-pay | Admitting: Family Medicine

## 2017-02-01 DIAGNOSIS — Z1231 Encounter for screening mammogram for malignant neoplasm of breast: Secondary | ICD-10-CM

## 2017-02-06 ENCOUNTER — Ambulatory Visit: Payer: Medicare Other | Admitting: Internal Medicine

## 2017-03-02 ENCOUNTER — Ambulatory Visit
Admission: RE | Admit: 2017-03-02 | Discharge: 2017-03-02 | Disposition: A | Discharge status: Home / Self Care | Payer: Medicare Other | Source: Ambulatory Visit | Attending: Family Medicine | Admitting: Family Medicine

## 2017-03-02 DIAGNOSIS — Z1231 Encounter for screening mammogram for malignant neoplasm of breast: Secondary | ICD-10-CM

## 2017-03-03 ENCOUNTER — Encounter: Payer: Self-pay | Admitting: Internal Medicine

## 2017-03-03 ENCOUNTER — Ambulatory Visit (INDEPENDENT_AMBULATORY_CARE_PROVIDER_SITE_OTHER): Payer: Medicare Other | Admitting: Internal Medicine

## 2017-03-03 VITALS — BP 132/72 | HR 84 | Ht 62.0 in | Wt 160.8 lb

## 2017-03-03 DIAGNOSIS — I10 Essential (primary) hypertension: Secondary | ICD-10-CM | POA: Diagnosis not present

## 2017-03-03 DIAGNOSIS — I872 Venous insufficiency (chronic) (peripheral): Secondary | ICD-10-CM | POA: Diagnosis not present

## 2017-03-03 NOTE — Progress Notes (Signed)
OFFICE NOTE  Chief Complaint:  Leg swelling follow-up  Primary Care Physician: Almedia Balls, MD  HPI:  DEJANA PUGSLEY is a 76 year old female previously followed by Dr. Lynnea Ferrier with a history of DVT in 2010. She was on Coumadin for a while, however, since has been taken off of that. She also has a history of Crohn disease and dyslipidemia. Recently we performed another ultrasound of her legs which showed resolution of the DVT in the right lower extremity. With regards to her cholesterol, she had a lipid profile performed by me with NMR which showed a total particle number of 2064, total cholesterol of 206, triglycerides 237, HDL 55, and LDL 104. I recommended increasing her Crestor to 40 mg daily and she did that for a few weeks but noted some muscle pains and aches and then decreased it back to 20 mg. She has worked on also changing her diet, and she has had some improvement in her cholesterol profile. Today told cholesterol is now 192, triglycerides 219, LDL of 100, HDL of 48. However, her calculated LDL by NMR was 77 with an LDL particle number of 1531, down from about 2000. This indicates significant progress as well as reduction in her risk of coronary events. Her previous LPIR score, which is a global calculation of risk based on lipoprotein profile, was 83 and is currently 78.  Today she has no specific complaints. She's been doing a little less exercise than she had previously and has had about 4-5 pound weight gain. Images to see how this has affected her cholesterol profile. Denies any chest pain or worsening shortness of breath with exertion. She reports no flares of her Crohn's colitis.  I saw Ms. pitz back today. She remains asymptomatic. Recently she underwent lifeline screening testing shows mild bilateral carotid artery disease based on peak systolic velocities less than 454 cm/s. She also had a small amount of peripheral arterial disease with a decreased ABIs of 0.9 for the left and one  on the right. C reactive protein was elevated 1.3, which could be consistent with Crohn's. Other studies were normal. She is not known to have coronary disease however this is some evidence that she has peripheral vascular disease and would argue that she needs much more strict cholesterol control. Unfortunately she's been intolerant of any higher doses of cholesterol medication due to myalgias. She is currently on max tolerated dose of cholesterol medication.  [place] returns today for follow-up. She is without complaints. Blood pressure is well-controlled. She had recent cholesterol check in January by her primary care provider which we will request. She also had lifeline screening which she gets every year and that was stable according to her report. She will send [place] that information. She denies any chest pain or worsening shortness of breath.  12/20/2016  Tarae was seen back today in follow-up. She has no new complaints except she does have some lower extremity swelling. This is been an issue recently. She does have more swelling in the morning which gets better at night when she elevates her feet. She is on amlodipine 10 mg daily for blood pressure control. She also takes low-dose hydrochlorothiazide. 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  03/03/2017  Ethlyn returns today for follow-up. She reports her leg swellings improve significantly after decreasing her amlodipine to 5 mg daily and increasing her losartan to 100 mg daily. Blood pressure still well controlled 132/72. She's  wearing her compression stockings and she has no significant swelling. In addition her symptoms such as discomfort, achiness and heaviness have improved as well.  PMHx:  Past Medical History:  Diagnosis Date  . Asthma   . Crohn's disease (HCC)   . Dyslipidemia   . History of DVT (deep vein thrombosis) 2010  . Hyperlipidemia   . Hypertension     Past Surgical  History:  Procedure Laterality Date  . APPENDECTOMY    . BREAST CYST ASPIRATION Right no date documented  . TONSILLECTOMY    . TRANSTHORACIC ECHOCARDIOGRAM  05/2010   EF=>55%; normal RVSP; mild mitral annular calcif, trace MR; trace TR; AV mildly sclerotic    FAMHx:  Family History  Problem Relation Age of Onset  . Cancer Father     SOCHx:   reports that she has never smoked. She has never used smokeless tobacco. She reports that she does not drink alcohol or use drugs.  ALLERGIES:  Allergies  Allergen Reactions  . Lisinopril Cough  . Penicillins     ROS: A comprehensive review of systems was negative.  HOME MEDS: Current Outpatient Prescriptions  Medication Sig Dispense Refill  . amLODipine (NORVASC) 5 MG tablet Take 1 tablet (5 mg total) by mouth daily. 90 tablet 3  . aspirin 81 MG tablet Take 81 mg by mouth daily.    . balsalazide (COLAZAL) 750 MG capsule Take 3,000 mg by mouth 2 (two) times daily.     . Calcium Carbonate-Vit D-Min (CALCIUM 1200 PO) Take 1 tablet by mouth daily.    . cetirizine (ZYRTEC) 10 MG tablet Take 10 mg by mouth daily.    . Coenzyme Q10 (CO Q 10) 100 MG CAPS Take 1 capsule by mouth daily.    . fenofibrate (TRICOR) 145 MG tablet Take 1 tablet (145 mg total) by mouth daily. 30 tablet 11  . fluticasone (FLONASE) 50 MCG/ACT nasal spray as needed.    . hydrochlorothiazide (HYDRODIURIL) 25 MG tablet Take 1 tablet by mouth daily.    . Inulin (FIBER CHOICE PO) Take 4 capsules by mouth daily.    Marland Kitchen losartan (COZAAR) 100 MG tablet Take 1 tablet (100 mg total) by mouth daily. 90 tablet 3  . metoprolol succinate (TOPROL-XL) 100 MG 24 hr tablet Take 1 tablet by mouth daily.    . Milk Thistle 250 MG CAPS Take by mouth.    . Multiple Vitamin (MULTIVITAMIN) capsule Take 1 capsule by mouth daily.    Marland Kitchen neomycin-polymyxin-hydrocortisone (CORTISPORIN) otic solution Apply one to two drops to toe after soaking twice daily. 10 mL 0  . Niacin (VITAMIN B-3 PO) Take 1,000  mg by mouth daily.    . rosuvastatin (CRESTOR) 20 MG tablet Take 20 mg by mouth daily.    Marland Kitchen TEMAZEPAM PO Take 1 tablet by mouth at bedtime.    . traMADol (ULTRAM) 50 MG tablet Take by mouth every 6 (six) hours as needed.    . TURMERIC PO Take by mouth.    . vitamin B-12 (CYANOCOBALAMIN) 1000 MCG tablet Take 1,000 mcg by mouth daily.    . vitamin C (ASCORBIC ACID) 500 MG tablet Take 1,000 mg by mouth daily.    Marland Kitchen omeprazole (PRILOSEC) 40 MG capsule Take 40 mg by mouth as directed.     No current facility-administered medications for this visit.     LABS/IMAGING: No results found for this or any previous visit (from the past 48 hour(s)). Mm Screening Breast Tomo Bilateral  Result Date: 03/02/2017  CLINICAL DATA:  Screening. EXAM: 2D DIGITAL SCREENING BILATERAL MAMMOGRAM WITH CAD AND ADJUNCT TOMO COMPARISON:  Previous exam(s). ACR Breast Density Category b: There are scattered areas of fibroglandular density. FINDINGS: There are no findings suspicious for malignancy. Images were processed with CAD. IMPRESSION: No mammographic evidence of malignancy. A result letter of this screening mammogram will be mailed directly to the patient. RECOMMENDATION: Screening mammogram in one year. (Code:SM-B-01Y) BI-RADS CATEGORY  1: Negative. Electronically Signed   By: Ted Mcalpineobrinka  Dimitrova M.D.   On: 03/02/2017 16:43    VITALS: BP 132/72   Pulse 84   Ht 5\' 2"  (1.575 m)   Wt 160 lb 12.8 oz (72.9 kg)   SpO2 94%   BMI 29.41 kg/m   EXAM: General appearance: alert and no distress Lungs: clear to auscultation bilaterally Heart: regular rate and rhythm, S1, S2 normal, no murmur, click, rub or gallop Extremities: extremities normal, atraumatic, no cyanosis or edema and venous stasis dermatitis noted Neurologic: Grossly normal  EKG: Deferred  ASSESSMENT: 1. Hypertension-well controlled 2. Dyslipidemia-not at goal, on max dose statin 3. Crohn's colitis 4. History of DVT-no recurrence on aspirin 5. Mild  bilateral carotid artery disease 6. 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   Mrs. Kirk RuthsGoad reports marked improvement in her swelling with compression stockings and change in her medicines. Blood pressure remains at goal. We'll continue her current medications and advise her wear compression stockings as much as possible, understanding that they're hot to wear in the summer. Should she have continued symptoms despite stocking where she may be candidate for venous ablation. Follow-up in 6 months.   Chrystie NoseKenneth C. Hilty, MD, [facility] Center[facility] Attending Cardiologist [facility]  Chrystie NoseKenneth C Hilty 03/03/2017, 8:50 AM

## 2017-03-03 NOTE — Patient Instructions (Signed)
Your physician wants you to follow-up in: 6 months with Dr. Hilty. You will receive a reminder letter in the mail two months in advance. If you don't receive a letter, please call our office to schedule the follow-up appointment.    

## 2017-10-09 ENCOUNTER — Ambulatory Visit: Payer: Medicare Other | Admitting: Internal Medicine

## 2017-10-30 ENCOUNTER — Ambulatory Visit (INDEPENDENT_AMBULATORY_CARE_PROVIDER_SITE_OTHER): Payer: Medicare Other | Admitting: Internal Medicine

## 2017-10-30 ENCOUNTER — Encounter: Payer: Self-pay | Admitting: Internal Medicine

## 2017-10-30 VITALS — BP 142/82 | HR 80 | Ht 62.5 in | Wt 150.6 lb

## 2017-10-30 DIAGNOSIS — E875 Hyperkalemia: Secondary | ICD-10-CM | POA: Diagnosis not present

## 2017-10-30 DIAGNOSIS — I872 Venous insufficiency (chronic) (peripheral): Secondary | ICD-10-CM | POA: Diagnosis not present

## 2017-10-30 DIAGNOSIS — E785 Hyperlipidemia, unspecified: Secondary | ICD-10-CM

## 2017-10-30 DIAGNOSIS — N179 Acute kidney failure, unspecified: Secondary | ICD-10-CM | POA: Insufficient documentation

## 2017-10-30 DIAGNOSIS — I10 Essential (primary) hypertension: Secondary | ICD-10-CM

## 2017-10-30 NOTE — Patient Instructions (Signed)
Medication Instructions: Your physician recommends that you continue on your current medications as directed. Please refer to the Current Medication list given to you today.  If you need a refill on your cardiac medications before your next appointment, please call your pharmacy.   Labwork: Your physician recommends that you have the following FASTING lab: Lipid   Follow-Up: Your physician wants you to follow-up in: 6 months with Dr. Rennis GoldenHilty. You will receive a reminder letter in the mail two months in advance. If you don't receive a letter, please call our office at [phone] to schedule this follow-up appointment.   [facility] you [facility]!!

## 2017-10-30 NOTE — Progress Notes (Signed)
OFFICE NOTE  Chief Complaint:  No complaints  Primary Care Physician: Wilburn MylarKelly, Samuel S, MD  HPI:  Laura Duffy is a 76 year old female previously followed by Dr. Lynnea FerrierSolomon with a history of DVT in 2010. She was on Coumadin for a while, however, since has been taken off of that. She also has a history of Crohn disease and dyslipidemia. Recently we performed another ultrasound of her legs which showed resolution of the DVT in the right lower extremity. With regards to her cholesterol, she had a lipid profile performed by me with NMR which showed a total particle number of 2064, total cholesterol of 206, triglycerides 237, HDL 55, and LDL 104. I recommended increasing her Crestor to 40 mg daily and she did that for a few weeks but noted some muscle pains and aches and then decreased it back to 20 mg. She has worked on also changing her diet, and she has had some improvement in her cholesterol profile. Today told cholesterol is now 192, triglycerides 219, LDL of 100, HDL of 48. However, her calculated LDL by NMR was 77 with an LDL particle number of 1531, down from about 2000. This indicates significant progress as well as reduction in her risk of coronary events. Her previous LPIR score, which is a global calculation of risk based on lipoprotein profile, was 83 and is currently 78.  Today she has no specific complaints. She's been doing a little less exercise than she had previously and has had about 4-5 pound weight gain. Images to see how this has affected her cholesterol profile. Denies any chest pain or worsening shortness of breath with exertion. She reports no flares of her Crohn's colitis.  I saw Laura Duffy back today. She remains asymptomatic. Recently she underwent lifeline screening testing shows mild bilateral carotid artery disease based on peak systolic velocities less than 161110 cm/s. She also had a small amount of peripheral arterial disease with a decreased ABIs of 0.9 for the left and one on  the right. C reactive protein was elevated 1.3, which could be consistent with Crohn's. Other studies were normal. She is not known to have coronary disease however this is some evidence that she has peripheral vascular disease and would argue that she needs much more strict cholesterol control. Unfortunately she's been intolerant of any higher doses of cholesterol medication due to myalgias. She is currently on max tolerated dose of cholesterol medication.  Laura Duffy returns today for follow-up. She is without complaints. Blood pressure is well-controlled. She had recent cholesterol check in January by her primary care provider which we will request. She also had lifeline screening which she gets every year and that was stable according to her report. She will send us that information. She denies any chest pain or worsening shortness of breath.  12/20/2016  Laura Duffy was seen back today in follow-up. She has no new complaints except she does have some lower extremity swelling. This is been an issue recently. She does have more swelling in the morning which gets better at night when she elevates her feet. She is on amlodipine 10 mg daily for blood pressure control. She also takes low-dose hydrochlorothiazide. 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  03/03/2017  Laura Duffy returns today for follow-up. She reports her leg swellings improve significantly after decreasing her amlodipine to 5 mg daily and increasing her losartan to 100 mg daily. Blood pressure still well controlled 132/72.  She's wearing her compression stockings and she has no significant swelling. In addition her symptoms such as discomfort, achiness and heaviness have improved as well.  10/30/2017  Laura Duffy was seen today in follow-up.  Overall she seems to be doing well.  Recently she was noted to have progressive hyperkalemia.  Blood work over 1 month.  Showed an increase in potassium from 4.5 up to  5.9.  This was associated with an increase in creatinine from normal up to 1.35.  She was referred to nephrology and there continue to work with her on this.  No clear etiology was noted.  She did reports she was eating more than the usual number of cantaloupes, which are high in potassium and wondered if that was associated with it.  She reports good control over her lower extremity swelling and wears compression stockings.  PMHx:  Past Medical History:  Diagnosis Date  . Asthma   . Crohn's disease (HCC)   . Dyslipidemia   . History of DVT (deep vein thrombosis) 2010  . Hyperlipidemia   . Hypertension     Past Surgical History:  Procedure Laterality Date  . APPENDECTOMY    . BREAST CYST ASPIRATION Right no date documented  . TONSILLECTOMY    . TRANSTHORACIC ECHOCARDIOGRAM  05/2010   EF=>55%; normal RVSP; mild mitral annular calcif, trace MR; trace TR; AV mildly sclerotic    FAMHx:  Family History  Problem Relation Age of Onset  . Cancer Father     SOCHx:   reports that  has never smoked. she has never used smokeless tobacco. She reports that she does not drink alcohol or use drugs.  ALLERGIES:  Allergies  Allergen Reactions  . Lisinopril Cough  . Penicillins     ROS: Pertinent items noted in HPI and remainder of comprehensive ROS otherwise negative.  HOME MEDS: Current Outpatient Medications  Medication Sig Dispense Refill  . amLODipine (NORVASC) 5 MG tablet Take 5 mg by mouth daily.    Marland Kitchen. aspirin 81 MG tablet Take 81 mg by mouth daily.    . Calcium Carbonate-Vit D-Min (CALCIUM 1200 PO) Take 1 tablet by mouth daily.    . cetirizine (ZYRTEC) 10 MG tablet Take 10 mg by mouth daily.    . Coenzyme Q10 (CO Q 10) 100 MG CAPS Take 1 capsule by mouth daily.    . fenofibrate (TRICOR) 145 MG tablet Take 1 tablet (145 mg total) by mouth daily. 30 tablet 11  . fluticasone (FLONASE) 50 MCG/ACT nasal spray as needed.    . hydrochlorothiazide (HYDRODIURIL) 25 MG tablet Take 1  tablet by mouth daily.    Marland Kitchen. losartan (COZAAR) 100 MG tablet Take 100 mg by mouth daily.    . Milk Thistle 250 MG CAPS Take by mouth.    . Multiple Vitamin (MULTIVITAMIN) capsule Take 1 capsule by mouth daily.    . Niacin (VITAMIN B-3 PO) Take 1,000 mg by mouth daily.    Marland Kitchen. omeprazole (PRILOSEC) 40 MG capsule Take 40 mg by mouth as directed.    . rosuvastatin (CRESTOR) 20 MG tablet Take 20 mg by mouth daily.    Marland Kitchen. sulfaSALAzine (AZULFIDINE) 500 MG tablet Take 2 tablets by mouth 2 (two) times daily.    Marland Kitchen. TEMAZEPAM PO Take 1 tablet by mouth at bedtime.    . TURMERIC PO Take by mouth.    . vitamin B-12 (CYANOCOBALAMIN) 1000 MCG tablet Take 1,000 mcg by mouth daily.    . vitamin C (ASCORBIC ACID) 500 MG  tablet Take 1,000 mg by mouth daily.     No current facility-administered medications for this visit.     LABS/IMAGING: No results found for this or any previous visit (from the past 48 hour(s)). No results found.  VITALS: BP (!) 142/82   Pulse 80   Ht 5' 2.5" (1.588 m)   Wt 150 lb 9.6 oz (68.3 kg)   BMI 27.11 kg/m   EXAM: General appearance: alert and no distress Neck: no carotid bruit, no JVD and thyroid not enlarged, symmetric, no tenderness/mass/nodules Lungs: clear to auscultation bilaterally Heart: regular rate and rhythm, S1, S2 normal, no murmur, click, rub or gallop Abdomen: soft, non-tender; bowel sounds normal; no masses,  no organomegaly Extremities: extremities normal, atraumatic, no cyanosis or edema and venous stasis dermatitis noted Pulses: 2+ and symmetric Skin: Skin color, texture, turgor normal. No rashes or lesions Neurologic: Grossly normal Psych: Pleasant  EKG: Sinus rhythm at 80-personally reviewed  ASSESSMENT: 1. Hypertension-well controlled 2. Dyslipidemia-not at goal, on max dose statin 3. Crohn's colitis 4. History of DVT-no recurrence on aspirin 5. Mild bilateral carotid artery disease 6. 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 Recent acute kidney injury 8. Hyperkalemia  PLAN: 1.   Mrs. Alkhatib is to be doing well from a cardiac standpoint.  Blood pressure runs in the 130s and 140 systolic.  I contemplated changing her medications however with recent acute kidney injury, I will defer to her nephrologist on this.  She had hyperkalemia which is likely more related to worsening renal function rather than eating too many cantaloupes however it is reasonable to reduce her potassium intake.  She is due for repeat lipid profile.  The last study was in 2017.  Follow-up 6 months.  Chrystie Nose, MD, [facility], FACP  [place]  [facility]  Medical Director of the [facility]  [facility] Attending Cardiologist  Direct Dial: [phone]  Fax: [phone]  Website:  [email or web address] Nicely Sidharth Leverette 10/30/2017, 2:54 PM

## 2017-11-10 LAB — LIPID PANEL
CHOL/HDL RATIO: 3.3 ratio (ref 0.0–4.4)
Cholesterol, Total: 207 mg/dL — ABNORMAL HIGH (ref 100–199)
HDL: 62 mg/dL (ref >39)
LDL Calculated: 112 mg/dL — ABNORMAL HIGH (ref 0–99)
TRIGLYCERIDES: 166 mg/dL — AB (ref 0–149)
VLDL Cholesterol Cal: 33 mg/dL (ref 5–40)

## 2017-11-15 ENCOUNTER — Telehealth: Payer: Self-pay | Admitting: Internal Medicine

## 2017-11-15 NOTE — Telephone Encounter (Signed)
Spoke with patient and confirmed she is taking both the Fenofibrate 145 mg daily and Rosuvastatin 20 mg daily Will forward to Dr Rennis GoldenHilty for review   Notes recorded by Lindell SparElkins, [name] M, RN on 11/14/2017 at 11:08 AM EST LMTCB on [phone] (Home) *Preferred* ------  Notes recorded by Chrystie NoseHilty, Kenneth C, MD on 11/11/2017 at 7:13 PM EST LDL Double previous value - ?compliant with Crestor. May need additional treatment.  Dr. HRexene Edison

## 2017-11-15 NOTE — Telephone Encounter (Signed)
New message ° ° °Patient calling for lab results. Please call °

## 2017-11-17 MED ORDER — ROSUVASTATIN CALCIUM 40 MG PO TABS: 40.0000 mg | ORAL_TABLET | Freq: Every day | ORAL | 5 refills | Status: AC

## 2017-11-17 NOTE — Telephone Encounter (Signed)
LMTCB

## 2017-11-17 NOTE — Telephone Encounter (Signed)
New message ° °Pt verbalized that she is returning call for RN °

## 2017-11-17 NOTE — Telephone Encounter (Signed)
Notes recorded by Chrystie NoseHilty, Kenneth C, MD on 11/15/2017 at 11:13 AM EST If there are not any new medicines, then it could only be explained by diet. Would recommend increase Crestor to 40 mg daily and aggressively work on reducing saturated fat to >20% of calories in her diet on a daily basis.  Dr. HRexene Edison

## 2017-11-17 NOTE — Telephone Encounter (Signed)
Returned call to patient. She reports she is not eating a lot of saturated fats - she eats vegetables, salads. She states her kidney function has worsened and she has changed her diet recently to avoid potassium-rich foods. She is willing to try crestor 40mg  daily but states she tried this about 2 years ago and took this dose for 3 days and had SE. Advised her to call back if she has symptoms. Rx(s) sent to pharmacy electronically.

## 2017-11-27 ENCOUNTER — Telehealth: Payer: Self-pay | Admitting: Internal Medicine

## 2017-11-27 NOTE — Telephone Encounter (Signed)
Ok .. Sounds good.  Dr. HRexene Edison

## 2017-11-27 NOTE — Telephone Encounter (Signed)
Patient calling, states that she is returning a call.

## 2017-11-27 NOTE — Telephone Encounter (Signed)
Spoke with pt, she is already watching her diet closely. She is not able to eat high potassium foods so she is not eating dairy, vegetables or sweets. She is not able to tolerate crestor 40 mg once daily. She is going to take the 40 mg alternating with 20 mg every other day. Will make dr hilty aware.

## 2017-12-26 ENCOUNTER — Other Ambulatory Visit: Payer: Self-pay | Admitting: Internal Medicine

## 2018-01-02 ENCOUNTER — Other Ambulatory Visit: Payer: Self-pay | Admitting: Family Medicine

## 2018-01-02 DIAGNOSIS — Z1231 Encounter for screening mammogram for malignant neoplasm of breast: Secondary | ICD-10-CM

## 2018-03-05 ENCOUNTER — Ambulatory Visit
Admission: RE | Admit: 2018-03-05 | Discharge: 2018-03-05 | Disposition: A | Discharge status: Home / Self Care | Payer: Medicare Other | Source: Ambulatory Visit | Attending: Family Medicine | Admitting: Family Medicine

## 2018-03-05 DIAGNOSIS — Z1231 Encounter for screening mammogram for malignant neoplasm of breast: Secondary | ICD-10-CM

## 2018-05-22 ENCOUNTER — Encounter: Payer: Self-pay | Admitting: Internal Medicine

## 2018-05-22 ENCOUNTER — Ambulatory Visit (INDEPENDENT_AMBULATORY_CARE_PROVIDER_SITE_OTHER): Payer: Medicare Other | Admitting: Internal Medicine

## 2018-05-22 VITALS — BP 134/62 | HR 77 | Ht 62.0 in | Wt 145.0 lb

## 2018-05-22 DIAGNOSIS — N183 Chronic kidney disease, stage 3 unspecified: Secondary | ICD-10-CM

## 2018-05-22 DIAGNOSIS — I10 Essential (primary) hypertension: Secondary | ICD-10-CM

## 2018-05-22 DIAGNOSIS — E785 Hyperlipidemia, unspecified: Secondary | ICD-10-CM

## 2018-05-22 MED ORDER — EZETIMIBE 10 MG PO TABS: 10.0000 mg | ORAL_TABLET | Freq: Every day | ORAL | 3 refills | Status: DC

## 2018-05-22 NOTE — Patient Instructions (Addendum)
Your physician has recommended you make the following change in your medication: START zetia 10mg  daily  Your physician recommends that you return for lab work in 3 months (fasting) to check cholesterol   Your physician wants you to follow-up in: 6 months with Dr. Rennis GoldenHilty. You will receive a reminder letter in the mail two months in advance. If you don't receive a letter, please call our office to schedule the follow-up appointment.

## 2018-05-22 NOTE — Progress Notes (Signed)
OFFICE NOTE  Chief Complaint:  No complaints  Primary Care Physician: Wilburn MylarKelly, Samuel S, MD  HPI:  Laura Duffy is a 77 year old female previously followed by Dr. Lynnea Duffy with a history of DVT in 2010. She was on Coumadin for a while, however, since has been taken off of that. She also has a history of Crohn disease and dyslipidemia. Recently we performed another ultrasound of her legs which showed resolution of the DVT in the right lower extremity. With regards to her cholesterol, she had a lipid profile performed by me with NMR which showed a total particle number of 2064, total cholesterol of 206, triglycerides 237, HDL 55, and LDL 104. I recommended increasing her Crestor to 40 mg daily and she did that for a few weeks but noted some muscle pains and aches and then decreased it back to 20 mg. She has worked on also changing her diet, and she has had some improvement in her cholesterol profile. Today told cholesterol is now 192, triglycerides 219, LDL of 100, HDL of 48. However, her calculated LDL by NMR was 77 with an LDL particle number of 1531, down from about 2000. This indicates significant progress as well as reduction in her risk of coronary events. Her previous LPIR score, which is a global calculation of risk based on lipoprotein profile, was 83 and is currently 78.  Today she has no specific complaints. She'Duffy been doing a little less exercise than she had previously and has had about 4-5 pound weight gain. Images to see how this has affected her cholesterol profile. Denies any chest pain or worsening shortness of breath with exertion. 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  I saw Laura Duffy back today. She remains asymptomatic. Recently she underwent lifeline screening testing shows mild bilateral carotid artery disease based on peak systolic velocities less than 161110 cm/Duffy. She also had a small amount of peripheral arterial disease with a decreased ABIs of 0.9 for the left and one on  the right. C reactive protein was elevated 1.3, which could be consistent with Crohn'Duffy. Other studies were normal. She is not known to have coronary disease however this is some evidence that she has peripheral vascular disease and would argue that she needs much more strict cholesterol control. Unfortunately she'Duffy been intolerant of any higher doses of cholesterol medication due to myalgias. She is currently on max tolerated dose of cholesterol medication.  Laura Duffy returns today for follow-up. She is without complaints. Blood pressure is well-controlled. She had recent cholesterol check in January by her primary care provider which we will request. She also had lifeline screening which she gets every year and that was stable according to her report. She will send us that information. She denies any chest pain or worsening shortness of breath.  12/20/2016  Laura Duffy was seen back today in follow-up. She has no new complaints except she does have some lower extremity swelling. This is been an issue recently. She does have more swelling in the morning which gets better at night when she elevates her feet. She is on amlodipine 10 mg daily for blood pressure control. She also takes low-dose hydrochlorothiazide. 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  03/03/2017  Laura Duffy returns today for follow-up. She reports her leg swellings improve significantly after decreasing her amlodipine to 5 mg daily and increasing her losartan to 100 mg daily. Blood pressure still well controlled 132/72.  She'Duffy wearing her compression stockings and she has no significant swelling. In addition her symptoms such as discomfort, achiness and heaviness have improved as well.  10/30/2017  Laura Duffy was seen today in follow-up.  Overall she seems to be doing well.  Recently she was noted to have progressive hyperkalemia.  Blood work over 1 month.  Showed an increase in potassium from 4.5 up to  5.9.  This was associated with an increase in creatinine from normal up to 1.35.  She was referred to nephrology and there continue to work with her on this.  No clear etiology was noted.  She did reports she was eating more than the usual number of cantaloupes, which are high in potassium and wondered if that was associated with it.  She reports good control over her lower extremity swelling and wears compression stockings.  05/22/2018  Laura Duffy returns for follow-up. She is doing well without complaints. Blood pressure is at goal today. She is due for repeat lipid testing. She has Stage 3 CKD- we may need to consider decreasing the dose of her fibrate. She has been on crestor 40 mg daily. Recent LDL from May was 110. Goal LDL <70 with carotid artery disease.  PMHx:  Past Medical History:  Diagnosis Date  . Asthma   . Crohn'Duffy disease (HCC)   . Dyslipidemia   . History of DVT (deep vein thrombosis) 2010  . Hyperlipidemia   . Hypertension     Past Surgical History:  Procedure Laterality Date  . APPENDECTOMY    . BREAST CYST ASPIRATION Right no date documented  . TONSILLECTOMY    . TRANSTHORACIC ECHOCARDIOGRAM  05/2010   EF=>55%; normal RVSP; mild mitral annular calcif, trace MR; trace TR; AV mildly sclerotic    FAMHx:  Family History  Problem Relation Age of Onset  . Cancer Father     SOCHx:   reports that she has never smoked. She has never used smokeless tobacco. She reports that she does not drink alcohol or use drugs.  ALLERGIES:  Allergies  Allergen Reactions  . Lisinopril Cough  . Penicillins     ROS: Pertinent items noted in HPI and remainder of comprehensive ROS otherwise negative.  HOME MEDS: Current Outpatient Medications  Medication Sig Dispense Refill  . amLODipine (NORVASC) 5 MG tablet TAKE 1 TABLET BY MOUTH ONCE DAILY 90 tablet 3  . aspirin 81 MG tablet Take 81 mg by mouth daily.    . Calcium Carbonate-Vit D-Min (CALCIUM 1200 PO) Take 1 tablet by mouth daily.     . cetirizine (ZYRTEC) 10 MG tablet Take 10 mg by mouth daily.    . Coenzyme Q10 (CO Q 10) 100 MG CAPS Take 1 capsule by mouth daily.    . fenofibrate (TRICOR) 145 MG tablet Take 1 tablet (145 mg total) by mouth daily. 30 tablet 11  . fluticasone (FLONASE) 50 MCG/ACT nasal spray as needed.    . hydrochlorothiazide (HYDRODIURIL) 25 MG tablet Take 1 tablet by mouth daily.    Marland Kitchen. losartan (COZAAR) 100 MG tablet Take 100 mg by mouth daily.    . Milk Thistle 250 MG CAPS Take by mouth.    . Multiple Vitamin (MULTIVITAMIN) capsule Take 1 capsule by mouth daily.    . Niacin (VITAMIN B-3 PO) Take 1,000 mg by mouth daily.    Marland Kitchen. omeprazole (PRILOSEC) 40 MG capsule Take 40 mg by mouth as directed.    . rosuvastatin (CRESTOR) 40 MG tablet Take 1 tablet (40 mg total) by  mouth daily. 30 tablet 5  . sulfaSALAzine (AZULFIDINE) 500 MG tablet Take 2 tablets by mouth 2 (two) times daily.    Marland Kitchen TEMAZEPAM PO Take 1 tablet by mouth at bedtime.    . TURMERIC PO Take by mouth.    . vitamin B-12 (CYANOCOBALAMIN) 1000 MCG tablet Take 1,000 mcg by mouth daily.    . vitamin C (ASCORBIC ACID) 500 MG tablet Take 1,000 mg by mouth daily.     No current facility-administered medications for this visit.     LABS/IMAGING: No results found for this or any previous visit (from the past 48 hour(Duffy)). No results found.  VITALS: BP 134/62   Pulse 77   Ht 5\' 2"  (1.575 m)   Wt 145 lb (65.8 kg)   BMI 26.52 kg/m   EXAM: General appearance: alert and no distress Neck: no carotid bruit, no JVD and thyroid not enlarged, symmetric, no tenderness/mass/nodules Lungs: clear to auscultation bilaterally Heart: regular rate and rhythm, S1, S2 normal, no murmur, click, rub or gallop Abdomen: soft, non-tender; bowel sounds normal; no masses,  no organomegaly Extremities: extremities normal, atraumatic, no cyanosis or edema and venous stasis dermatitis noted Pulses: 2+ and symmetric Skin: Skin color, texture, turgor normal. No rashes  or lesions Neurologic: Grossly normal Psych: Pleasant  EKG: NSR with sinus arrythmia at 77 - personally reviewed.  ASSESSMENT: 1. Hypertension-well controlled 2. Dyslipidemia-not at goal, on max dose statin 3. Crohn'Duffy colitis 4. History of DVT-no recurrence on aspirin 5. Mild bilateral carotid artery disease 6. 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 Recent acute kidney injury 8. Hyperkalemia  PLAN: 1.   Mrs. Bosket continues to do well. Cholesterol is not at goal on high dose Crestor. Will add Zetia 10 mg daily. Check lipid profile in 3 months.   Follow-up 6 months.  Chrystie Nose, MD, [facility], FACP  [facility]  Medical Director of the Advanced Lipid Disorders &  Cardiovascular Risk Reduction Clinic Attending Cardiologist  Direct Dial: [phone]  Fax: [phone]  Website:  [email or web address]  Lisette Abu Derhonda Eastlick 05/22/2018, 2:25 PM

## 2018-08-29 LAB — LIPID PANEL
CHOL/HDL RATIO: 3 ratio (ref 0.0–4.4)
Cholesterol, Total: 195 mg/dL (ref 100–199)
HDL: 64 mg/dL (ref >39)
LDL Calculated: 103 mg/dL — ABNORMAL HIGH (ref 0–99)
Triglycerides: 140 mg/dL (ref 0–149)
VLDL CHOLESTEROL CAL: 28 mg/dL (ref 5–40)

## 2018-11-21 ENCOUNTER — Other Ambulatory Visit: Payer: Self-pay | Admitting: Internal Medicine

## 2018-11-21 ENCOUNTER — Ambulatory Visit (INDEPENDENT_AMBULATORY_CARE_PROVIDER_SITE_OTHER): Payer: Medicare Other | Admitting: Internal Medicine

## 2018-11-21 ENCOUNTER — Encounter: Payer: Self-pay | Admitting: Internal Medicine

## 2018-11-21 VITALS — BP 132/64 | HR 82 | Ht 62.5 in | Wt 143.0 lb

## 2018-11-21 DIAGNOSIS — I6523 Occlusion and stenosis of bilateral carotid arteries: Secondary | ICD-10-CM | POA: Diagnosis not present

## 2018-11-21 DIAGNOSIS — E785 Hyperlipidemia, unspecified: Secondary | ICD-10-CM | POA: Diagnosis not present

## 2018-11-21 DIAGNOSIS — I10 Essential (primary) hypertension: Secondary | ICD-10-CM | POA: Diagnosis not present

## 2018-11-21 NOTE — Patient Instructions (Signed)
Medication Instructions:  Continue current medications Dr. Rennis Golden may recommend starting you back on Praluent (after carotid doppler resutls) If you need a refill on your cardiac medications before your next appointment, please call your pharmacy.   Lab work: NONE If you have labs (blood work) drawn today and your tests are completely normal, you will receive your results only by: Marland Kitchen MyChart Message (if you have MyChart) OR . A paper copy in the mail If you have any lab test that is abnormal or we need to change your treatment, we will call you to review the results.  Testing/Procedures: Your physician has requested that you have a carotid duplex. This test is an ultrasound of the carotid arteries in your neck. It looks at blood flow through these arteries that supply the brain with blood. Allow one hour for this exam. There are no restrictions or special instructions. -- this is done @ Dr. Blanchie Dessert office  Follow-Up: At [facility], you and your health needs are our priority.  As part of our continuing mission to provide you with exceptional heart care, we have created designated Provider Care Teams.  These Care Teams include your primary Cardiologist (physician) and Advanced Practice Providers (APPs -  Physician Assistants and Nurse Practitioners) who all work together to provide you with the care you need, when you need it. You will need a follow up appointment in 6 months.  Please call our office 2 months in advance to schedule this appointment.  You may see Chrystie Nose, MD or one of the following Advanced Practice Providers on your designated Care Team:   . Azalee Course, [place] . Bettina Gavia, PA  Any Other Special Instructions Will Be Listed Below (If Applicable).

## 2018-11-21 NOTE — Progress Notes (Signed)
OFFICE NOTE  Chief Complaint:  Routine follow-up  Primary Care Physician: Wilburn Mylar, MD  HPI:  Laura Duffy is a 78 year old female previously followed by Dr. Lynnea Ferrier with a history of DVT in 2010. She was on Coumadin for a while, however, since has been taken off of that. She also has a history of Crohn disease and dyslipidemia. Recently we performed another ultrasound of her legs which showed resolution of the DVT in the right lower extremity. With regards to her cholesterol, she had a lipid profile performed by me with NMR which showed a total particle number of 2064, total cholesterol of 206, triglycerides 237, HDL 55, and LDL 104. I recommended increasing her Crestor to 40 mg daily and she did that for a few weeks but noted some muscle pains and aches and then decreased it back to 20 mg. She has worked on also changing her diet, and she has had some improvement in her cholesterol profile. Today told cholesterol is now 192, triglycerides 219, LDL of 100, HDL of 48. However, her calculated LDL by NMR was 77 with an LDL particle number of 1531, down from about 2000. This indicates significant progress as well as reduction in her risk of coronary events. Her previous LPIR score, which is a global calculation of risk based on lipoprotein profile, was 83 and is currently 78.  Today she has no specific complaints. She's been doing a little less exercise than she had previously and has had about 4-5 pound weight gain. Images to see how this has affected her cholesterol profile. Denies any chest pain or worsening shortness of breath with exertion. She reports no flares of her Crohn's colitis.  I saw Laura Duffy back today. She remains asymptomatic. Recently she underwent lifeline screening testing shows mild bilateral carotid artery disease based on peak systolic velocities less than 510 cm/s. She also had a small amount of peripheral arterial disease with a decreased ABIs of 0.9 for the left and  one on the right. C reactive protein was elevated 1.3, which could be consistent with Crohn's. Other studies were normal. She is not known to have coronary disease however this is some evidence that she has peripheral vascular disease and would argue that she needs much more strict cholesterol control. Unfortunately she's been intolerant of any higher doses of cholesterol medication due to myalgias. She is currently on max tolerated dose of cholesterol medication.  Laura Duffy returns today for follow-up. She is without complaints. Blood pressure is well-controlled. She had recent cholesterol check in January by her primary care provider which we will request. She also had lifeline screening which she gets every year and that was stable according to her report. She will send [place] that information. She denies any chest pain or worsening shortness of breath.  12/20/2016  Averyana was seen back today in follow-up. She has no new complaints except she does have some lower extremity swelling. This is been an issue recently. She does have more swelling in the morning which gets better at night when she elevates her feet. She is on amlodipine 10 mg daily for blood pressure control. She also takes low-dose hydrochlorothiazide. 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  03/03/2017  Laura Duffy returns today for follow-up. She reports her leg swellings improve significantly after decreasing her amlodipine to 5 mg daily and increasing her losartan to 100 mg daily. Blood pressure still well controlled 132/72.  She's wearing her compression stockings and she has no significant swelling. In addition her symptoms such as discomfort, achiness and heaviness have improved as well.  10/30/2017  Laura Duffy was seen today in follow-up.  Overall she seems to be doing well.  Recently she was noted to have progressive hyperkalemia.  Blood work over 1 month.  Showed an increase in potassium from 4.5  up to 5.9.  This was associated with an increase in creatinine from normal up to 1.35.  She was referred to nephrology and there continue to work with her on this.  No clear etiology was noted.  She did reports she was eating more than the usual number of cantaloupes, which are high in potassium and wondered if that was associated with it.  She reports good control over her lower extremity swelling and wears compression stockings.  05/22/2018  Laura Duffy returns for follow-up. She is doing well without complaints. Blood pressure is at goal today. She is due for repeat lipid testing. She has Stage 3 CKD- we may need to consider decreasing the dose of her fibrate. She has been on crestor 40 mg daily. Recent LDL from May was 110. Goal LDL <70 with carotid artery disease.  11/21/2018  Laura Duffy is seen today in follow-up.  Overall she is doing well.  Recent lab work showed LDL remains elevated at 103 on Crestor and ezetimibe.  She does have stage III chronic kidney disease.  Her goal LDL is less than 70.  EKG today shows sinus rhythm with first-degree AV block at 82.  She is asymptomatic.  PMHx:  Past Medical History:  Diagnosis Date  . Asthma   . Crohn's disease (HCC)   . Dyslipidemia   . History of DVT (deep vein thrombosis) 2010  . Hyperlipidemia   . Hypertension     Past Surgical History:  Procedure Laterality Date  . APPENDECTOMY    . BREAST CYST ASPIRATION Right no date documented  . TONSILLECTOMY    . TRANSTHORACIC ECHOCARDIOGRAM  05/2010   EF=>55%; normal RVSP; mild mitral annular calcif, trace MR; trace TR; AV mildly sclerotic    FAMHx:  Family History  Problem Relation Age of Onset  . Cancer Father     SOCHx:   reports that she has never smoked. She has never used smokeless tobacco. She reports that she does not drink alcohol or use drugs.  ALLERGIES:  Allergies  Allergen Reactions  . Lisinopril Cough  . Penicillins     ROS: Pertinent items noted in HPI and remainder of  comprehensive ROS otherwise negative.  HOME MEDS: Current Outpatient Medications  Medication Sig Dispense Refill  . amLODipine (NORVASC) 5 MG tablet TAKE 1 TABLET BY MOUTH ONCE DAILY 90 tablet 3  . aspirin 81 MG tablet Take 81 mg by mouth daily.    . Calcium Carbonate-Vit D-Min (CALCIUM 1200 PO) Take 1 tablet by mouth daily.    . cetirizine (ZYRTEC) 10 MG tablet Take 10 mg by mouth daily.    . Coenzyme Q10 (CO Q 10) 100 MG CAPS Take 1 capsule by mouth daily.    Marland Kitchen. ezetimibe (ZETIA) 10 MG tablet Take 1 tablet (10 mg total) by mouth daily. 90 tablet 3  . fenofibrate (TRICOR) 145 MG tablet Take 1 tablet (145 mg total) by mouth daily. 30 tablet 11  . fluticasone (FLONASE) 50 MCG/ACT nasal spray as needed.    . hydrochlorothiazide (HYDRODIURIL) 25 MG tablet Take 1 tablet by mouth daily.    Marland Kitchen. losartan (COZAAR)  100 MG tablet Take 100 mg by mouth daily.    . Milk Thistle 250 MG CAPS Take by mouth.    . Multiple Vitamin (MULTIVITAMIN) capsule Take 1 capsule by mouth daily.    . Niacin (VITAMIN B-3 PO) Take 1,000 mg by mouth daily.    Marland Kitchen. omeprazole (PRILOSEC) 40 MG capsule Take 40 mg by mouth as directed.    . rosuvastatin (CRESTOR) 40 MG tablet Take 1 tablet (40 mg total) by mouth daily. 30 tablet 5  . sulfaSALAzine (AZULFIDINE) 500 MG tablet Take 2 tablets by mouth 2 (two) times daily.    Marland Kitchen. TEMAZEPAM PO Take 1 tablet by mouth at bedtime.    . TURMERIC PO Take by mouth.    . vitamin B-12 (CYANOCOBALAMIN) 1000 MCG tablet Take 1,000 mcg by mouth daily.    . vitamin C (ASCORBIC ACID) 500 MG tablet Take 1,000 mg by mouth daily.     No current facility-administered medications for this visit.     LABS/IMAGING: No results found for this or any previous visit (from the past 48 hour(s)). No results found.  VITALS: BP 132/64   Pulse 82   Ht 5' 2.5" (1.588 m)   Wt 143 lb (64.9 kg)   BMI 25.74 kg/m   EXAM: General appearance: alert and no distress Neck: no carotid bruit, no JVD and thyroid not  enlarged, symmetric, no tenderness/mass/nodules Lungs: clear to auscultation bilaterally Heart: regular rate and rhythm, S1, S2 normal, no murmur, click, rub or gallop Abdomen: soft, non-tender; bowel sounds normal; no masses,  no organomegaly Extremities: extremities normal, atraumatic, no cyanosis or edema and venous stasis dermatitis noted Pulses: 2+ and symmetric Skin: Skin color, texture, turgor normal. No rashes or lesions Neurologic: Grossly normal Psych: Pleasant  EKG: This rhythm first-degree AV block at 82 -reviewed  ASSESSMENT: 1. Hypertension-well controlled 2. Dyslipidemia-not at goal, on max dose statin 3. Crohn's colitis 4. History of DVT-no recurrence on aspirin 5. Mild bilateral carotid artery disease 6. 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 Recent acute kidney injury 8. Hyperkalemia  PLAN: 1.   Mrs. Kirk RuthsGoad continues to be above goal LDL less than 70 on max dose statin therapy.  I think she is a good candidate for PCSK9 inhibitor.  He has known bilateral carotid artery disease, multiple cardiovascular risk factors including age, hypertension and inflammation in the setting of Crohn's colitis.  Was previously on Praluent and had been approved for that.  We will plan to repeat her carotid Dopplers but likely restart Praluent as she remains above goal LDL.  Follow-up with me in 6 months.  Chrystie NoseKenneth C. Lilas Diefendorf, MD, [facility] CenterFACC, FACP  [facility]  Medical Director of the Advanced Lipid Disorders &  Cardiovascular Risk Reduction Clinic Attending Cardiologist  Direct Dial: [phone]  Fax: [phone]  Website:  [email or web address]  Jacqueleen Pulver C Ciaran Begay 11/21/2018, 2:14 PM

## 2018-11-22 ENCOUNTER — Telehealth: Payer: Self-pay | Admitting: Internal Medicine

## 2018-11-22 ENCOUNTER — Encounter: Payer: Self-pay | Admitting: Internal Medicine

## 2018-11-22 NOTE — Telephone Encounter (Signed)
Spoke to patient. Patient states  Yesterday she was schedule to have a carotid doppler. Patient states she was told to contact her insurance concerning authorization to have test. Patient states she called insurance  And they told her the doctor's office would have to call for pre cert Patient wanted to know if we need the phone number to get that accomplish. RN informed patient the per-cert department handles this prior to her appointment and they have the numbers they need.  The office gives the patient the information in case you have question in regards to test and how the  bills will be distributed. She verbalized understanding.

## 2018-11-22 NOTE — Telephone Encounter (Signed)
New Message:    Patient would like for some one to call her concerning her insurance who need some paper work from the doctor. Please call patient. She can also be reach on her cell phone.

## 2018-11-28 ENCOUNTER — Ambulatory Visit (HOSPITAL_COMMUNITY)
Admission: RE | Admit: 2018-11-28 | Discharge: 2018-11-28 | Disposition: A | Discharge status: Home / Self Care | Payer: Medicare Other | Source: Ambulatory Visit | Attending: Cardiovascular Disease | Admitting: Cardiovascular Disease

## 2018-11-28 DIAGNOSIS — I6523 Occlusion and stenosis of bilateral carotid arteries: Secondary | ICD-10-CM | POA: Diagnosis present

## 2018-11-30 ENCOUNTER — Telehealth: Payer: Self-pay | Admitting: Internal Medicine

## 2018-11-30 NOTE — Telephone Encounter (Signed)
While attempting prior authorization via [email or web address], received notice that [employer]'s covered (formulary) drugs are Repatha SureClick subcutaneous pen injector, Repatha Syringe subcutaneous syringe, Repatha Pushtronex subcutaneous wearable injector, atorvastatin tablet and rosuvastatin tablet.  Routed to MD to [facility] change

## 2018-11-30 NOTE — Telephone Encounter (Signed)
Spoke with patient who states she contacted [employer] (part D drug coverage) about Praluent. Explained that a PA will need to be submitted and once approved we can find out co-pay from pharmacy.   She agrees with this plan.   [employer] ID: [id number] BIN: [id number] PCN: [id number] RxGrp: [id number]

## 2018-11-30 NOTE — Telephone Encounter (Signed)
Yes, ok to change to Repatha twice weekly.  Dr. Rexene Edison

## 2018-11-30 NOTE — Telephone Encounter (Signed)
New Message   Pt c/o medication issue:  1. Name of Medication: Praulent  2. How are you currently taking this medication (dosage and times per day)?   3. Are you having a reaction (difficulty breathing--STAT)?   4. What is your medication issue? Patient is calling because she spoke with her insurance company and they do not cover the medication but can if there is an authorization sent. Please call to discuss.

## 2018-12-03 ENCOUNTER — Telehealth: Payer: Self-pay | Admitting: Internal Medicine

## 2018-12-03 DIAGNOSIS — E785 Hyperlipidemia, unspecified: Secondary | ICD-10-CM

## 2018-12-03 NOTE — Telephone Encounter (Signed)
LM for patient that PA for Repatha SureClick 140mg /mL q14days will be submitted to her insurance, since this is preferred. OK to leave message per DPR.

## 2018-12-03 NOTE — Telephone Encounter (Signed)
PA for Repatha 140mg /mL q2weeks submitted via [email or web address] Originally had planned for Praluent 150mg /mL q2weeks, however Repatha is preferred (Key: [id number])   Dx: hyperlipidemia, carotid artery stenosis  PA Case: [id number] Status: Approved Coverage Starts on: 12/03/2018 12:00:00 AM Coverage Ends on: 06/01/2019 12:00:00 AM

## 2018-12-04 NOTE — Addendum Note (Signed)
Addended by: Lindell Spar on: 12/04/2018 10:18 AM   Modules accepted: Orders

## 2018-12-04 NOTE — Telephone Encounter (Signed)
LMTCB to discuss Repatha approval.  Need to get OK to send Rx to pharmacy Will need to know co-pay amount from patient If not affordable, can pursue patient assistance  Sent info to patient via MyChart as well

## 2018-12-05 ENCOUNTER — Other Ambulatory Visit: Payer: Self-pay | Admitting: Pharmacist Clinician (PhC)/ Clinical Pharmacy Specialist

## 2018-12-05 MED ORDER — EVOLOCUMAB 140 MG/ML ~~LOC~~ SOAJ: 140.0000 mg | SUBCUTANEOUS | 12 refills | Status: DC

## 2018-12-18 ENCOUNTER — Telehealth: Payer: Self-pay

## 2018-12-18 NOTE — Telephone Encounter (Signed)
Called to let the pt know that I will be filling in for jenna and emailed her a copy of the pt assistance forms for the pt to fill out and send back to the office so Dr. Rennis Golden can complete his portion.

## 2018-12-24 ENCOUNTER — Telehealth: Payer: Self-pay

## 2018-12-24 NOTE — Telephone Encounter (Signed)
Pt call,ed to let the lipid clinic know that she was approved from the [employer] via $15,200 which will cover her cost for the whole year and the money comes on a card to use specifically for the repatha.

## 2018-12-24 NOTE — Telephone Encounter (Signed)
Called and spoke with pt regarding denial from [employer] to receive repatha free from the manufacturer and gave the pt the # to the [employer] to apply through them

## 2019-02-05 ENCOUNTER — Telehealth: Payer: Self-pay | Admitting: Pharmacist

## 2019-02-05 NOTE — Telephone Encounter (Signed)
Verbal Rx give for 1x replacement of Repatha.

## 2019-03-29 ENCOUNTER — Other Ambulatory Visit: Payer: Self-pay | Admitting: Family Medicine

## 2019-03-29 DIAGNOSIS — Z1231 Encounter for screening mammogram for malignant neoplasm of breast: Secondary | ICD-10-CM

## 2019-04-30 ENCOUNTER — Ambulatory Visit
Admission: RE | Admit: 2019-04-30 | Discharge: 2019-04-30 | Disposition: A | Discharge status: Home / Self Care | Payer: Medicare Other | Source: Ambulatory Visit | Attending: Family Medicine | Admitting: Family Medicine

## 2019-04-30 ENCOUNTER — Other Ambulatory Visit: Payer: Self-pay

## 2019-04-30 DIAGNOSIS — Z1231 Encounter for screening mammogram for malignant neoplasm of breast: Secondary | ICD-10-CM

## 2019-06-03 ENCOUNTER — Telehealth: Payer: Self-pay | Admitting: Internal Medicine

## 2019-06-03 NOTE — Telephone Encounter (Signed)
Repatha [employer] prior authorization request was received in [email or web address] portal.   Upon attempting, received this notice: Message from [employer] already on file for this request.

## 2019-07-01 ENCOUNTER — Encounter: Payer: Self-pay | Admitting: Internal Medicine

## 2019-07-01 ENCOUNTER — Other Ambulatory Visit: Payer: Self-pay

## 2019-07-01 ENCOUNTER — Ambulatory Visit (INDEPENDENT_AMBULATORY_CARE_PROVIDER_SITE_OTHER): Payer: Medicare Other | Admitting: Internal Medicine

## 2019-07-01 VITALS — BP 148/76 | HR 76 | Temp 95.7°F | Ht 62.0 in | Wt 152.0 lb

## 2019-07-01 DIAGNOSIS — I6523 Occlusion and stenosis of bilateral carotid arteries: Secondary | ICD-10-CM | POA: Diagnosis not present

## 2019-07-01 DIAGNOSIS — E785 Hyperlipidemia, unspecified: Secondary | ICD-10-CM

## 2019-07-01 DIAGNOSIS — I10 Essential (primary) hypertension: Secondary | ICD-10-CM | POA: Diagnosis not present

## 2019-07-01 NOTE — Progress Notes (Signed)
OFFICE NOTE  Chief Complaint:  Routine follow-up  Primary Care Physician: Wilburn Mylar, MD  HPI:  Laura Duffy is a 78 year old female previously followed by Dr. Lynnea Duffy with a history of DVT in 2010. She was on Coumadin for a while, however, since has been taken off of that. She also has a history of Crohn disease and dyslipidemia. Recently we performed another ultrasound of her legs which showed resolution of the DVT in the right lower extremity. With regards to her cholesterol, she had a lipid profile performed by me with NMR which showed a total particle number of 2064, total cholesterol of 206, triglycerides 237, HDL 55, and LDL 104. I recommended increasing her Crestor to 40 mg daily and she did that for a few weeks but noted some muscle pains and aches and then decreased it back to 20 mg. She has worked on also changing her diet, and she has had some improvement in her cholesterol profile. Today told cholesterol is now 192, triglycerides 219, LDL of 100, HDL of 48. However, her calculated LDL by NMR was 77 with an LDL particle number of 1531, down from about 2000. This indicates significant progress as well as reduction in her risk of coronary events. Her previous LPIR score, which is a global calculation of risk based on lipoprotein profile, was 83 and is currently 78.  Today she has no specific complaints. She's been doing a little less exercise than she had previously and has had about 4-5 pound weight gain. Images to see how this has affected her cholesterol profile. Denies any chest pain or worsening shortness of breath with exertion. She reports no flares of her Crohn's colitis.  I saw Ms. Laura Duffy back today. She remains asymptomatic. Recently she underwent lifeline screening testing shows mild bilateral carotid artery disease based on peak systolic velocities less than 161 cm/s. She also had a small amount of peripheral arterial disease with a decreased ABIs of 0.9 for the left  and one on the right. C reactive protein was elevated 1.3, which could be consistent with Crohn's. Other studies were normal. She is not known to have coronary disease however this is some evidence that she has peripheral vascular disease and would argue that she needs much more strict cholesterol control. Unfortunately she's been intolerant of any higher doses of cholesterol medication due to myalgias. She is currently on max tolerated dose of cholesterol medication.  Laura Duffy returns today for follow-up. She is without complaints. Blood pressure is well-controlled. She had recent cholesterol check in January by her primary care provider which we will request. She also had lifeline screening which she gets every year and that was stable according to her report. She will send [place] that information. She denies any chest pain or worsening shortness of breath.  12/20/2016  Laura Duffy was seen back today in follow-up. She has no new complaints except she does have some lower extremity swelling. This is been an issue recently. She does have more swelling in the morning which gets better at night when she elevates her feet. She is on amlodipine 10 mg daily for blood pressure control. She also takes low-dose hydrochlorothiazide. 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  03/03/2017  Laura Duffy returns today for follow-up. She reports her leg swellings improve significantly after decreasing her amlodipine to 5 mg daily and increasing her losartan to 100 mg daily. Blood pressure still well controlled 132/72.  She's wearing her compression stockings and she has no significant swelling. In addition her symptoms such as discomfort, achiness and heaviness have improved as well.  10/30/2017  Laura Duffy was seen today in follow-up.  Overall she seems to be doing well.  Recently she was noted to have progressive hyperkalemia.  Blood work over 1 month.  Showed an increase in potassium from  4.5 up to 5.9.  This was associated with an increase in creatinine from normal up to 1.35.  She was referred to nephrology and there continue to work with her on this.  No clear etiology was noted.  She did reports she was eating more than the usual number of cantaloupes, which are high in potassium and wondered if that was associated with it.  She reports good control over her lower extremity swelling and wears compression stockings.  05/22/2018  Laura Duffy returns for follow-up. She is doing well without complaints. Blood pressure is at goal today. She is due for repeat lipid testing. She has Stage 3 CKD- we may need to consider decreasing the dose of her fibrate. She has been on crestor 40 mg daily. Recent LDL from May was 110. Goal LDL <70 with carotid artery disease.  11/21/2018  Laura Duffy is seen today in follow-up.  Overall she is doing well.  Recent lab work showed LDL remains elevated at 103 on Crestor and ezetimibe.  She does have stage III chronic kidney disease.  Her goal LDL is less than 70.  EKG today shows sinus rhythm with first-degree AV block at 82.  She is asymptomatic.  07/01/2019  Laura Duffy was seen today in follow-up.  She did have recent lab work to follow-up on dyslipidemia after starting Repatha.  Unfortunately she did get the medication however the first injector malfunction.  She got a second injector however had had lipid testing and noted marked improvement in her numbers and therefore she did not use the medication.  Her lipid profile from May 2020 showed total cholesterol 164 and LDL of 67.  Again, this is on rosuvastatin, fenofibrate and ezetimibe, however she had not started Repatha.  PMHx:  Past Medical History:  Diagnosis Date  . Asthma   . Crohn's disease ([place])   . Dyslipidemia   . History of DVT (deep vein thrombosis) 2010  . Hyperlipidemia   . Hypertension     Past Surgical History:  Procedure Laterality Date  . APPENDECTOMY    . BREAST CYST ASPIRATION Right no date  documented  . TONSILLECTOMY    . TRANSTHORACIC ECHOCARDIOGRAM  05/2010   EF=>55%; normal RVSP; mild mitral annular calcif, trace MR; trace TR; AV mildly sclerotic    FAMHx:  Family History  Problem Relation Age of Onset  . Cancer Father     SOCHx:   reports that she has never smoked. She has never used smokeless tobacco. She reports that she does not drink alcohol or use drugs.  ALLERGIES:  Allergies  Allergen Reactions  . Lisinopril Cough  . Penicillins     ROS: Pertinent items noted in HPI and remainder of comprehensive ROS otherwise negative.  HOME MEDS: Current Outpatient Medications  Medication Sig Dispense Refill  . amLODipine (NORVASC) 5 MG tablet TAKE 1 TABLET BY MOUTH ONCE DAILY 90 tablet 3  . aspirin 81 MG tablet Take 81 mg by mouth daily.    . Calcium Carbonate-Vit D-Min (CALCIUM 1200 PO) Take 1 tablet by mouth daily.    . cetirizine (ZYRTEC) 10 MG tablet Take 10 mg by  mouth daily.    . Coenzyme Q10 (CO Q 10) 100 MG CAPS Take 1 capsule by mouth daily.    . Evolocumab (REPATHA SURECLICK) 140 MG/ML SOAJ Inject 140 mg into the skin every 14 (fourteen) days. 2 pen 12  . fenofibrate (TRICOR) 145 MG tablet Take 1 tablet (145 mg total) by mouth daily. 30 tablet 11  . fluticasone (FLONASE) 50 MCG/ACT nasal spray as needed.    . hydrochlorothiazide (HYDRODIURIL) 25 MG tablet Take 1 tablet by mouth daily.    Marland Kitchen losartan (COZAAR) 100 MG tablet Take 100 mg by mouth daily.    . Milk Thistle 250 MG CAPS Take by mouth.    . Multiple Vitamin (MULTIVITAMIN) capsule Take 1 capsule by mouth daily.    . Niacin (VITAMIN B-3 PO) Take 1,000 mg by mouth daily.    Marland Kitchen omeprazole (PRILOSEC) 40 MG capsule Take 40 mg by mouth as directed.    . rosuvastatin (CRESTOR) 40 MG tablet Take 1 tablet (40 mg total) by mouth daily. 30 tablet 5  . sulfaSALAzine (AZULFIDINE) 500 MG tablet Take 2 tablets by mouth 2 (two) times daily.    Marland Kitchen TEMAZEPAM PO Take 1 tablet by mouth at bedtime.    . TURMERIC PO  Take by mouth.    . vitamin B-12 (CYANOCOBALAMIN) 1000 MCG tablet Take 1,000 mcg by mouth daily.    . vitamin C (ASCORBIC ACID) 500 MG tablet Take 1,000 mg by mouth daily.    Marland Kitchen ezetimibe (ZETIA) 10 MG tablet Take 1 tablet (10 mg total) by mouth daily. 90 tablet 3   No current facility-administered medications for this visit.     LABS/IMAGING: No results found for this or any previous visit (from the past 48 hour(s)). No results found.  VITALS: BP (!) 148/76 (BP Location: Left Arm, Patient Position: Sitting, Cuff Size: Normal)   Pulse 76   Temp (!) 95.7 F (35.4 C)   Ht 5\' 2"  (1.575 m)   Wt 152 lb (68.9 kg)   BMI 27.80 kg/m   EXAM: General appearance: alert and no distress Neck: no carotid bruit, no JVD and thyroid not enlarged, symmetric, no tenderness/mass/nodules Lungs: clear to auscultation bilaterally Heart: regular rate and rhythm, S1, S2 normal, no murmur, click, rub or gallop Abdomen: soft, non-tender; bowel sounds normal; no masses,  no organomegaly Extremities: extremities normal, atraumatic, no cyanosis or edema and venous stasis dermatitis noted Pulses: 2+ and symmetric Skin: Skin color, texture, turgor normal. No rashes or lesions Neurologic: Grossly normal Psych: Pleasant  EKG: Normal sinus rhythm 76-personally reviewed  ASSESSMENT: 1. Hypertension-well controlled 2. Dyslipidemia-not at goal, on max dose statin 3. Crohn's colitis 4. History of DVT-no recurrence on aspirin 5. Mild bilateral carotid artery disease 6. 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 Recent acute kidney injury 8. Hyperkalemia  PLAN: 1.   Mrs. Malan now has LDL less than 70 with primarily dietary changes I suspect and will not likely need to start Repatha since she has not started it.  I did ask her to hold onto it we will plan to repeat her lipids in about 6 months as she may need medication if she is not able to control it with diet.  Follow-up with  me in 6 months.   Chrystie Nose, MD, [facility], FACP  [place]  [facility]  Medical Director of the Advanced Lipid [facility] &  Cardiovascular Risk Reduction Clinic Attending Cardiologist  Direct Dial: [phone]  Fax: [phone]  Website:  [email or web address]   C  07/01/2019, 2:58 PM

## 2019-07-01 NOTE — Patient Instructions (Signed)
Medication Instructions:  Your physician recommends that you continue on your current medications as directed. Please refer to the Current Medication list given to you today.  If you need a refill on your cardiac medications before your next appointment, please call your pharmacy.   Lab work: FASTING lab work prior to next appointment to check cholesterol If you have labs (blood work) drawn today and your tests are completely normal, you will receive your results only by: Marland Kitchen MyChart Message (if you have MyChart) OR . A paper copy in the mail If you have any lab test that is abnormal or we need to change your treatment, we will call you to review the results.   Follow-Up: At [facility], you and your health needs are our priority.  As part of our continuing mission to provide you with exceptional heart care, we have created designated Provider Care Teams.  These Care Teams include your primary Cardiologist (physician) and Advanced Practice Providers (APPs -  Physician Assistants and Nurse Practitioners) who all work together to provide you with the care you need, when you need it. You will need a follow up appointment in 6 months.  Please call our office 2 months in advance to schedule this appointment.  You may see Pixie Casino, MD or one of the following Advanced Practice Providers on your designated Care Team: [place], [place] . Fabian Sharp, PA-C  Any Other Special Instructions Will Be Listed Below (If Applicable).

## 2019-07-19 IMAGING — MG DIGITAL SCREENING BILATERAL MAMMOGRAM WITH TOMO AND CAD
8 series · 8 of 24 positions shown · non-contrast
Comparison: Previous exam(s).

CLINICAL DATA: Screening.

EXAM:
DIGITAL SCREENING BILATERAL MAMMOGRAM WITH TOMO AND CAD

[R MLO synth-2D]
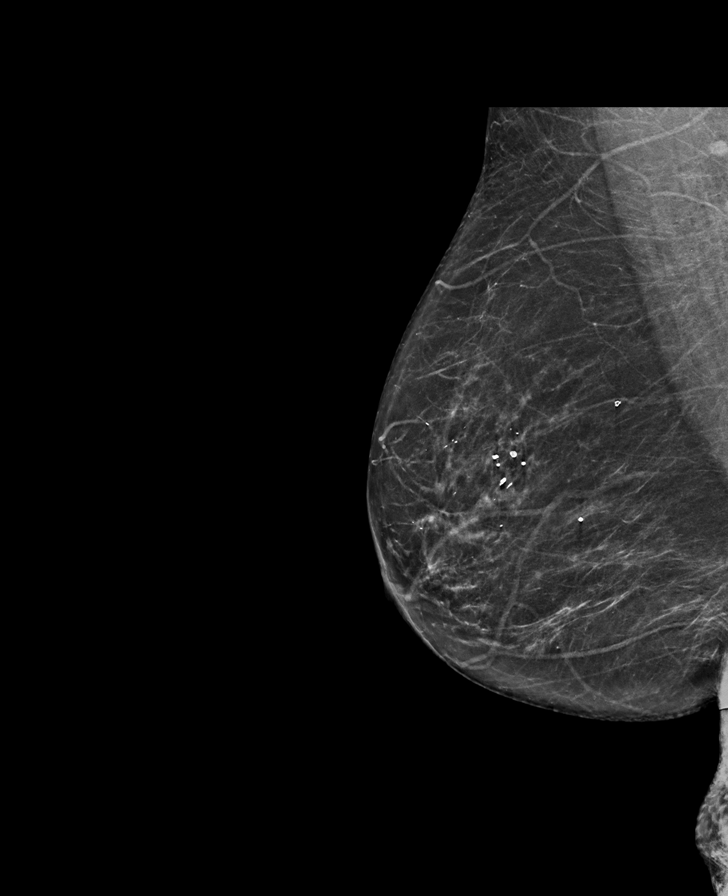

[R CC synth-2D]
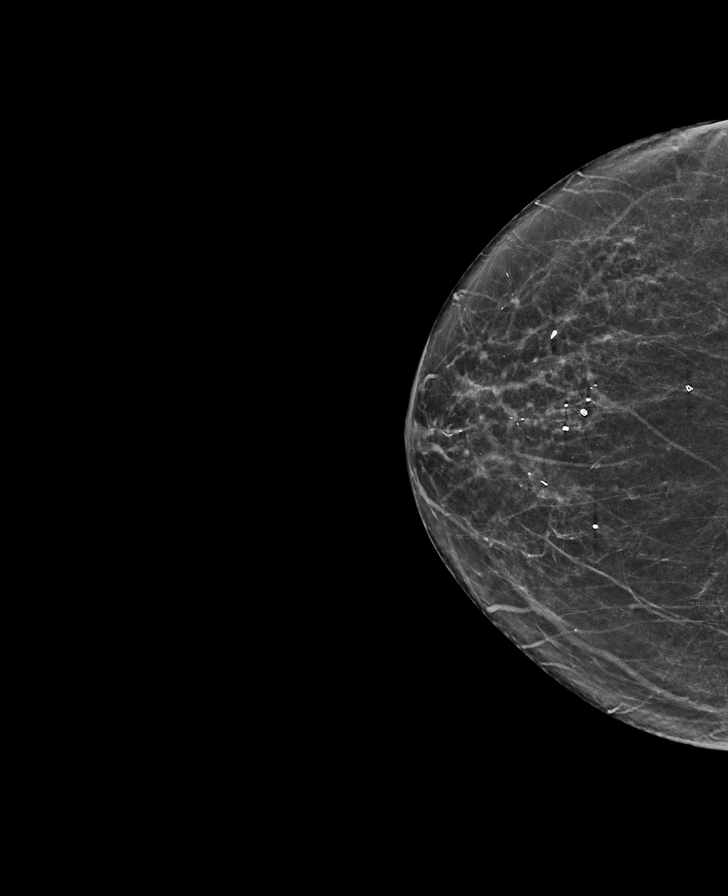

[L CC synth-2D]
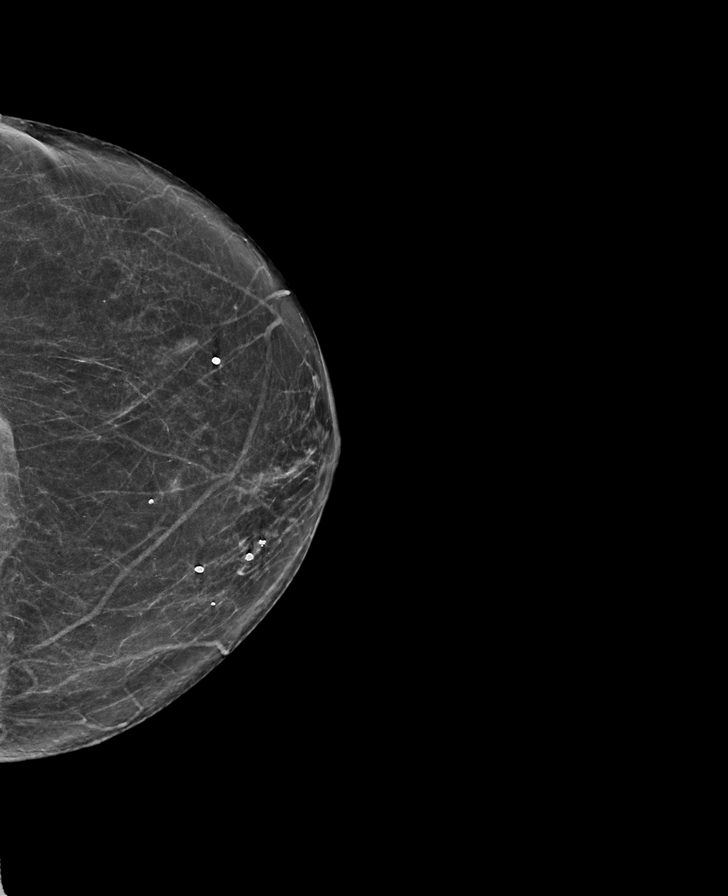

[L MLO synth-2D]
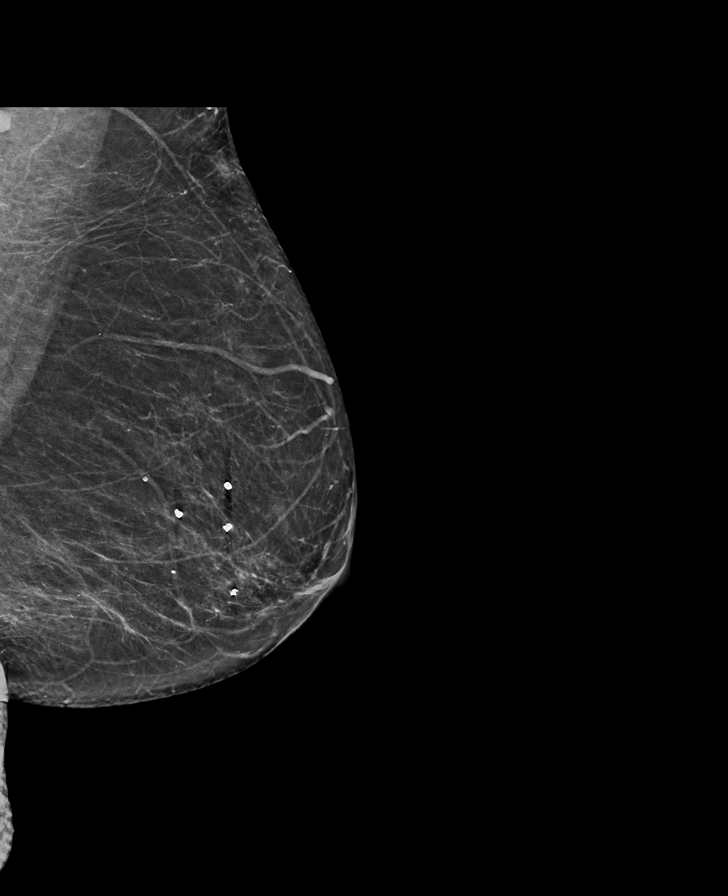

[L CC tomo · tomo slice 33/64.0]
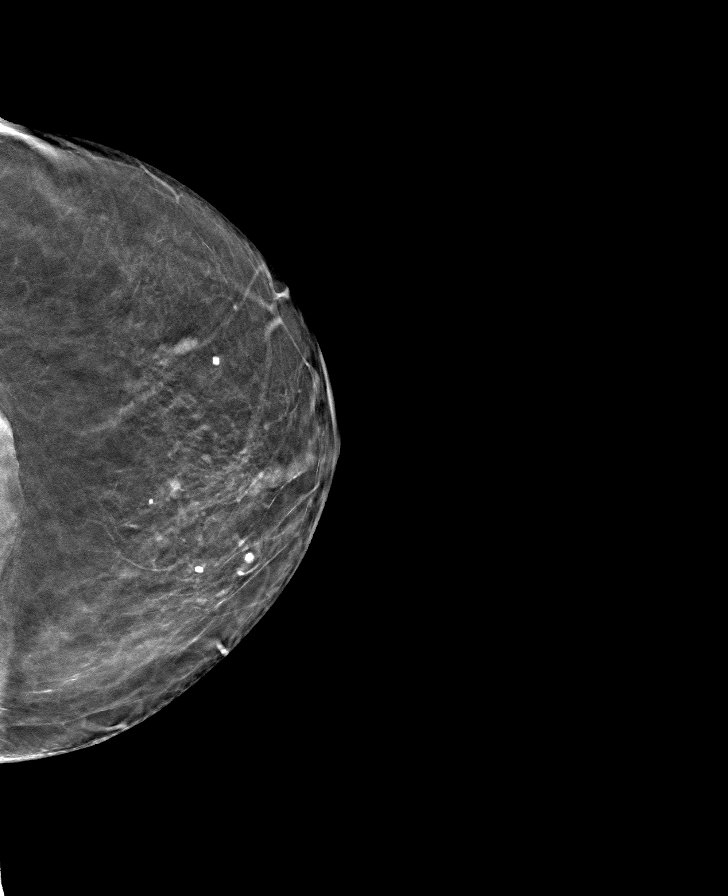

[L MLO tomo · tomo slice 33/65.0]
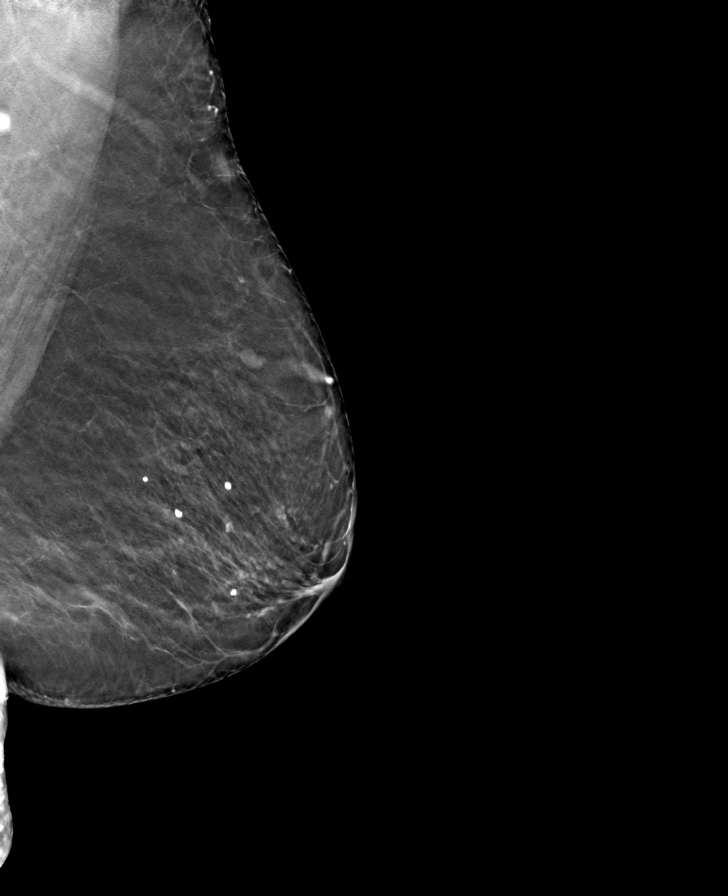

[R CC tomo · tomo slice 31/61.0]
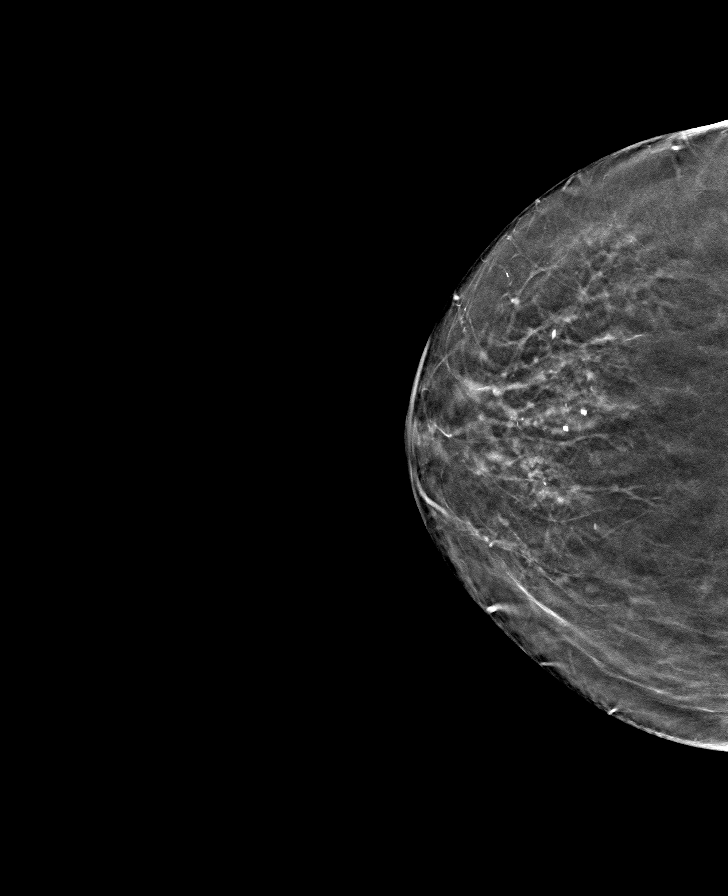

[R MLO tomo · tomo slice 34/67.0]
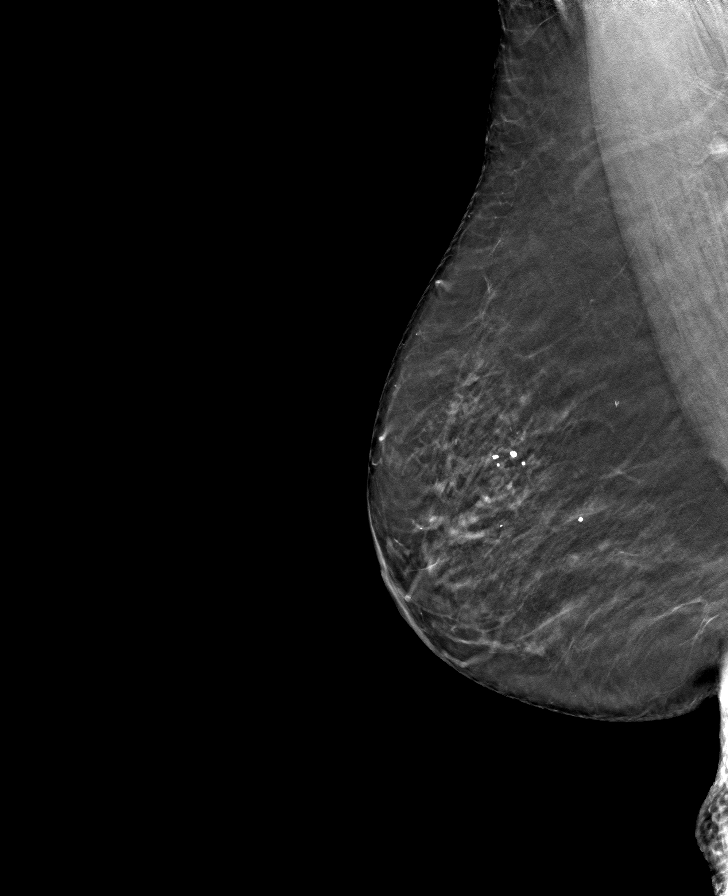

[8 of 24 positions shown; findings below may reference images not displayed]

ACR Breast Density Category b: There are scattered areas of
fibroglandular density.
FINDINGS: There are no findings suspicious for malignancy. Images were
processed with CAD.
IMPRESSION: No mammographic evidence of malignancy. A result letter of this
screening mammogram will be mailed directly to the patient.

RECOMMENDATION:
Screening mammogram in one year. (Code:[id number])

BI-RADS CATEGORY  1: Negative.

## 2019-08-19 ENCOUNTER — Other Ambulatory Visit: Payer: Self-pay | Admitting: Internal Medicine

## 2019-08-20 NOTE — Telephone Encounter (Signed)
Rx request sent to pharmacy.  

## 2019-11-18 ENCOUNTER — Other Ambulatory Visit: Payer: Self-pay | Admitting: Internal Medicine

## 2019-11-22 ENCOUNTER — Ambulatory Visit: Payer: Medicare Other | Attending: Internal Medicine

## 2019-11-22 DIAGNOSIS — Z23 Encounter for immunization: Secondary | ICD-10-CM

## 2019-11-22 NOTE — Progress Notes (Signed)
   Covid-19 Vaccination Clinic  Name:  Laura Duffy    MRN: [id number] DOB: 07-Jan-1941  11/22/2019  [name] was observed post Covid-19 immunization for 15 minutes without incidence. She was provided with Vaccine Information Sheet and instruction to access the V-Safe system.   [name] was instructed to call 911 with any severe reactions post vaccine: Marland Kitchen Difficulty breathing  . Swelling of your face and throat  . A fast heartbeat  . A bad rash all over your body  . Dizziness and weakness    Immunizations Administered    Name Date Dose VIS Date Route   Pfizer COVID-19 Vaccine 11/22/2019  2:50 PM 0.3 mL 10/11/2019 Intramuscular   Manufacturer: [employer], [employer]   Lot: [id number]   NDC: [address]-5

## 2019-12-13 ENCOUNTER — Ambulatory Visit: Payer: Medicare Other | Attending: Internal Medicine

## 2019-12-13 ENCOUNTER — Other Ambulatory Visit: Payer: Self-pay

## 2019-12-13 DIAGNOSIS — Z23 Encounter for immunization: Secondary | ICD-10-CM

## 2019-12-13 NOTE — Progress Notes (Signed)
   Covid-19 Vaccination Clinic  Name:  Navya Timmons    MRN: [id number] DOB: May 15, 1941  12/13/2019  Ms. Chiem was observed post Covid-19 immunization for 15 minutes without incidence. She was provided with Vaccine Information Sheet and instruction to access the V-Safe system.   Ms. Sieling was instructed to call 911 with any severe reactions post vaccine: Marland Kitchen Difficulty breathing  . Swelling of your face and throat  . A fast heartbeat  . A bad rash all over your body  . Dizziness and weakness    Immunizations Administered    Name Date Dose VIS Date Route   Pfizer COVID-19 Vaccine 12/13/2019  1:54 PM 0.3 mL 10/11/2019 Intramuscular   Manufacturer: [employer], [employer]   Lot: [id number]   NDC: [address]-8

## 2020-01-01 ENCOUNTER — Other Ambulatory Visit: Payer: Self-pay

## 2020-01-01 ENCOUNTER — Encounter: Payer: Self-pay | Admitting: Internal Medicine

## 2020-01-01 ENCOUNTER — Ambulatory Visit (INDEPENDENT_AMBULATORY_CARE_PROVIDER_SITE_OTHER): Payer: Medicare Other | Admitting: Internal Medicine

## 2020-01-01 VITALS — BP 140/68 | HR 82 | Ht 62.5 in | Wt 150.0 lb

## 2020-01-01 DIAGNOSIS — I10 Essential (primary) hypertension: Secondary | ICD-10-CM

## 2020-01-01 DIAGNOSIS — E785 Hyperlipidemia, unspecified: Secondary | ICD-10-CM

## 2020-01-01 DIAGNOSIS — I6529 Occlusion and stenosis of unspecified carotid artery: Secondary | ICD-10-CM

## 2020-01-01 NOTE — Patient Instructions (Signed)
Medication Instructions:  Your physician recommends that you continue on your current medications as directed. Please refer to the Current Medication list given to you today.   Testing/Procedures: Carotid Doppler @ Dr. Blanchie Dessert office   Follow-Up: At [facility], you and your health needs are our priority.  As part of our continuing mission to provide you with exceptional heart care, we have created designated Provider Care Teams.  These Care Teams include your primary Cardiologist (physician) and Advanced Practice Providers (APPs -  Physician Assistants and Nurse Practitioners) who all work together to provide you with the care you need, when you need it.  We recommend signing up for the patient portal called "MyChart".  Sign up information is provided on this After Visit Summary.  MyChart is used to connect with patients for Virtual Visits (Telemedicine).  Patients are able to view lab/test results, encounter notes, upcoming appointments, etc.  Non-urgent messages can be sent to your provider as well.   To learn more about what you can do with MyChart, go to [email or web address].    Your next appointment:   6 month(s)  The format for your next appointment:   In Person  Provider:   You may see Chrystie Nose, MD or one of the following Advanced Practice Providers on your designated Care Team:    Azalee Course, PA-C  Micah Flesher, PA-C or   Judy Pimple, [place]    Other Instructions

## 2020-01-01 NOTE — Progress Notes (Signed)
OFFICE NOTE  Chief Complaint:  Routine follow-up  Primary Care Physician: Laura Mylar, MD  HPI:  Laura Duffy is a 79 year old female previously followed by Dr. Lynnea Duffy with a history of DVT in 2010. She was on Coumadin for a while, however, since has been taken off of that. She also has a history of Crohn disease and dyslipidemia. Recently we performed another ultrasound of her legs which showed resolution of the DVT in the right lower extremity. With regards to her cholesterol, she had a lipid profile performed by me with NMR which showed a total particle number of 2064, total cholesterol of 206, triglycerides 237, HDL 55, and LDL 104. I recommended increasing her Crestor to 40 mg daily and she did that for a few weeks but noted some muscle pains and aches and then decreased it back to 20 mg. She has worked on also changing her diet, and she has had some improvement in her cholesterol profile. Today told cholesterol is now 192, triglycerides 219, LDL of 100, HDL of 48. However, her calculated LDL by NMR was 77 with an LDL particle number of 1531, down from about 2000. This indicates significant progress as well as reduction in her risk of coronary events. Her previous LPIR score, which is a global calculation of risk based on lipoprotein profile, was 83 and is currently 78.  Today she has no specific complaints. She's been doing a little less exercise than she had previously and has had about 4-5 pound weight gain. Images to see how this has affected her cholesterol profile. Denies any chest pain or worsening shortness of breath with exertion. She reports no flares of her Crohn's colitis.  I saw Laura Duffy back today. She remains asymptomatic. Recently she underwent lifeline screening testing shows mild bilateral carotid artery disease based on peak systolic velocities less than 161 cm/s. She also had a small amount of peripheral arterial disease with a decreased ABIs of 0.9 for the left  and one on the right. C reactive protein was elevated 1.3, which could be consistent with Crohn's. Other studies were normal. She is not known to have coronary disease however this is some evidence that she has peripheral vascular disease and would argue that she needs much more strict cholesterol control. Unfortunately she's been intolerant of any higher doses of cholesterol medication due to myalgias. She is currently on max tolerated dose of cholesterol medication.  Laura Duffy returns today for follow-up. She is without complaints. Blood pressure is well-controlled. She had recent cholesterol check in January by her primary care provider which we will request. She also had lifeline screening which she gets every year and that was stable according to her report. She will send [place] that information. She denies any chest pain or worsening shortness of breath.  12/20/2016  Laura Duffy was seen back today in follow-up. She has no new complaints except she does have some lower extremity swelling. This is been an issue recently. She does have more swelling in the morning which gets better at night when she elevates her feet. She is on amlodipine 10 mg daily for blood pressure control. She also takes low-dose hydrochlorothiazide. 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  03/03/2017  Laura Duffy returns today for follow-up. She reports her leg swellings improve significantly after decreasing her amlodipine to 5 mg daily and increasing her losartan to 100 mg daily. Blood pressure still well controlled 132/72.  She's wearing her compression stockings and she has no significant swelling. In addition her symptoms such as discomfort, achiness and heaviness have improved as well.  10/30/2017  Laura Duffy was seen today in follow-up.  Overall she seems to be doing well.  Recently she was noted to have progressive hyperkalemia.  Blood work over 1 month.  Showed an increase in potassium from  4.5 up to 5.9.  This was associated with an increase in creatinine from normal up to 1.35.  She was referred to nephrology and there continue to work with her on this.  No clear etiology was noted.  She did reports she was eating more than the usual number of cantaloupes, which are high in potassium and wondered if that was associated with it.  She reports good control over her lower extremity swelling and wears compression stockings.  05/22/2018  Laura Duffy returns for follow-up. She is doing well without complaints. Blood pressure is at goal today. She is due for repeat lipid testing. She has Stage 3 CKD- we may need to consider decreasing the dose of her fibrate. She has been on crestor 40 mg daily. Recent LDL from May was 110. Goal LDL <70 with carotid artery disease.  11/21/2018  Laura Duffy is seen today in follow-up.  Overall she is doing well.  Recent lab work showed LDL remains elevated at 103 on Crestor and ezetimibe.  She does have stage III chronic kidney disease.  Her goal LDL is less than 70.  EKG today shows sinus rhythm with first-degree AV block at 82.  She is asymptomatic.  07/01/2019  Laura Duffy was seen today in follow-up.  She did have recent lab work to follow-up on dyslipidemia after starting Repatha.  Unfortunately she did get the medication however the first injector malfunction.  She got a second injector however had had lipid testing and noted marked improvement in her numbers and therefore she did not use the medication.  Her lipid profile from May 2020 showed total cholesterol 164 and LDL of 67.  Again, this is on rosuvastatin, fenofibrate and ezetimibe, however she had not started Repatha.  01/01/2020  Laura Duffy returns today for follow-up.  She continues to do well with her lipids.  She was able to make significant improvement with dietary changes and LDL most recently was 71.  She is on Zetia, fenofibrate and 40 mg of Crestor.  She denies any chest pain or worsening shortness of breath.  Blood  pressure was a little elevated today.  EKG shows sinus rhythm with a first-degree AV block.  PMHx:  Past Medical History:  Diagnosis Date  . Asthma   . Crohn's disease ([place])   . Dyslipidemia   . History of DVT (deep vein thrombosis) 2010  . Hyperlipidemia   . Hypertension     Past Surgical History:  Procedure Laterality Date  . APPENDECTOMY    . BREAST CYST ASPIRATION Right no date documented  . TONSILLECTOMY    . TRANSTHORACIC ECHOCARDIOGRAM  05/2010   EF=>55%; normal RVSP; mild mitral annular calcif, trace MR; trace TR; AV mildly sclerotic    FAMHx:  Family History  Problem Relation Age of Onset  . Cancer Father     SOCHx:   reports that she has never smoked. She has never used smokeless tobacco. She reports that she does not drink alcohol or use drugs.  ALLERGIES:  Allergies  Allergen Reactions  . Lisinopril Cough  . Penicillins     ROS: Pertinent items noted in HPI and remainder  of comprehensive ROS otherwise negative.  HOME MEDS: Current Outpatient Medications  Medication Sig Dispense Refill  . amLODipine (NORVASC) 5 MG tablet TAKE 1 TABLET BY MOUTH ONCE DAILY 90 tablet 3  . aspirin 81 MG tablet Take 81 mg by mouth daily.    . Calcium Carbonate-Vit D-Min (CALCIUM 1200 PO) Take 1 tablet by mouth daily.    . cetirizine (ZYRTEC) 10 MG tablet Take 10 mg by mouth daily.    . Coenzyme Q10 (CO Q 10) 100 MG CAPS Take 1 capsule by mouth daily.    Marland Kitchen ezetimibe (ZETIA) 10 MG tablet Take 1 tablet (10 mg total) by mouth daily. 90 tablet 3  . fenofibrate (TRICOR) 145 MG tablet Take 1 tablet (145 mg total) by mouth daily. 30 tablet 11  . fluticasone (FLONASE) 50 MCG/ACT nasal spray as needed.    . hydrochlorothiazide (HYDRODIURIL) 25 MG tablet Take 1 tablet by mouth daily.    Marland Kitchen losartan (COZAAR) 100 MG tablet Take 100 mg by mouth daily.    . Milk Thistle 250 MG CAPS Take by mouth.    . Multiple Vitamin (MULTIVITAMIN) capsule Take 1 capsule by mouth daily.    . Niacin  (VITAMIN B-3 PO) Take 1,000 mg by mouth daily.    Marland Kitchen omeprazole (PRILOSEC) 40 MG capsule Take 40 mg by mouth as directed.    . rosuvastatin (CRESTOR) 40 MG tablet Take 1 tablet (40 mg total) by mouth daily. 30 tablet 5  . sulfaSALAzine (AZULFIDINE) 500 MG tablet Take 2 tablets by mouth 2 (two) times daily.    Marland Kitchen TEMAZEPAM PO Take 1 tablet by mouth at bedtime.    . TURMERIC PO Take by mouth.    . vitamin B-12 (CYANOCOBALAMIN) 1000 MCG tablet Take 1,000 mcg by mouth daily.    . vitamin C (ASCORBIC ACID) 500 MG tablet Take 1,000 mg by mouth daily.     No current facility-administered medications for this visit.    LABS/IMAGING: No results found for this or any previous visit (from the past 48 hour(s)). No results found.  VITALS: BP 140/68   Pulse 82   Ht 5' 2.5" (1.588 m)   Wt 150 lb (68 kg)   SpO2 92%   BMI 27.00 kg/m   EXAM: General appearance: alert and no distress Neck: no carotid bruit, no JVD and thyroid not enlarged, symmetric, no tenderness/mass/nodules Lungs: clear to auscultation bilaterally Heart: regular rate and rhythm, S1, S2 normal, no murmur, click, rub or gallop Abdomen: soft, non-tender; bowel sounds normal; no masses,  no organomegaly Extremities: extremities normal, atraumatic, no cyanosis or edema and venous stasis dermatitis noted Pulses: 2+ and symmetric Skin: Skin color, texture, turgor normal. No rashes or lesions Neurologic: Grossly normal Psych: Pleasant  EKG: Sinus rhythm first-degree block at 82-personally reviewed  ASSESSMENT: 1. Hypertension-well controlled 2. Dyslipidemia-not at goal, on max dose statin 3. Crohn's colitis 4. History of DVT-no recurrence on aspirin 5. Mild bilateral carotid artery disease 6. 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 Recent acute kidney injury 8. Hyperkalemia  PLAN: 1.   Mrs. Ramnauth continues to do well and LDL recently was 71.  She did have some mild carotid artery disease.  I  like to repeat those studies.  Continue current medications.  Blood pressure has been generally well controlled she says at home.  No changes to her medicines otherwise today.  Follow-up annually or sooner as necessary.  Chrystie Nose, MD, [facility], FACP    [facility]  Medical Director of the Advanced Lipid Disorders &  Cardiovascular Risk Reduction Clinic Attending Cardiologist  Direct Dial: [phone]  Fax: [phone]  Website:  [email or web address]  Lisette Abu Wanza Szumski 01/01/2020, 2:27 PM

## 2020-01-20 ENCOUNTER — Ambulatory Visit (HOSPITAL_COMMUNITY)
Admission: RE | Admit: 2020-01-20 | Discharge: 2020-01-20 | Disposition: A | Discharge status: Home / Self Care | Payer: Medicare Other | Source: Ambulatory Visit | Attending: Cardiology | Admitting: Cardiology

## 2020-01-20 ENCOUNTER — Other Ambulatory Visit: Payer: Self-pay

## 2020-01-20 DIAGNOSIS — I6529 Occlusion and stenosis of unspecified carotid artery: Secondary | ICD-10-CM | POA: Diagnosis present

## 2020-01-23 ENCOUNTER — Other Ambulatory Visit: Payer: Self-pay | Admitting: Internal Medicine

## 2020-01-23 DIAGNOSIS — I6523 Occlusion and stenosis of bilateral carotid arteries: Secondary | ICD-10-CM

## 2020-03-19 ENCOUNTER — Other Ambulatory Visit: Payer: Self-pay | Admitting: Family Medicine

## 2020-03-19 DIAGNOSIS — Z1231 Encounter for screening mammogram for malignant neoplasm of breast: Secondary | ICD-10-CM

## 2020-05-07 ENCOUNTER — Ambulatory Visit
Admission: RE | Admit: 2020-05-07 | Discharge: 2020-05-07 | Disposition: A | Discharge status: Home / Self Care | Payer: Medicare Other | Source: Ambulatory Visit | Attending: Family Medicine | Admitting: Family Medicine

## 2020-05-07 ENCOUNTER — Other Ambulatory Visit: Payer: Self-pay

## 2020-05-07 DIAGNOSIS — Z1231 Encounter for screening mammogram for malignant neoplasm of breast: Secondary | ICD-10-CM

## 2020-06-26 ENCOUNTER — Encounter: Payer: Self-pay | Admitting: Internal Medicine

## 2020-06-26 ENCOUNTER — Other Ambulatory Visit: Payer: Self-pay

## 2020-06-26 ENCOUNTER — Ambulatory Visit (INDEPENDENT_AMBULATORY_CARE_PROVIDER_SITE_OTHER): Payer: Medicare Other | Admitting: Internal Medicine

## 2020-06-26 VITALS — BP 144/72 | HR 72 | Ht 62.5 in | Wt 148.8 lb

## 2020-06-26 DIAGNOSIS — I6523 Occlusion and stenosis of bilateral carotid arteries: Secondary | ICD-10-CM | POA: Diagnosis not present

## 2020-06-26 DIAGNOSIS — I10 Essential (primary) hypertension: Secondary | ICD-10-CM | POA: Diagnosis not present

## 2020-06-26 DIAGNOSIS — I6529 Occlusion and stenosis of unspecified carotid artery: Secondary | ICD-10-CM

## 2020-06-26 DIAGNOSIS — E785 Hyperlipidemia, unspecified: Secondary | ICD-10-CM

## 2020-06-26 NOTE — Progress Notes (Signed)
OFFICE NOTE  Chief Complaint:  Routine follow-up  Primary Care Physician: Wilburn Mylar, MD  HPI:  Laura Duffy is a 79 year old female previously followed by Dr. Lynnea Ferrier with a history of DVT in 2010. She was on Coumadin for a while, however, since has been taken off of that. She also has a history of Crohn disease and dyslipidemia. Recently we performed another ultrasound of her legs which showed resolution of the DVT in the right lower extremity. With regards to her cholesterol, she had a lipid profile performed by me with NMR which showed a total particle number of 2064, total cholesterol of 206, triglycerides 237, HDL 55, and LDL 104. I recommended increasing her Crestor to 40 mg daily and she did that for a few weeks but noted some muscle pains and aches and then decreased it back to 20 mg. She has worked on also changing her diet, and she has had some improvement in her cholesterol profile. Today told cholesterol is now 192, triglycerides 219, LDL of 100, HDL of 48. However, her calculated LDL by NMR was 77 with an LDL particle number of 1531, down from about 2000. This indicates significant progress as well as reduction in her risk of coronary events. Her previous LPIR score, which is a global calculation of risk based on lipoprotein profile, was 83 and is currently 78.  Today she has no specific complaints. She's been doing a little less exercise than she had previously and has had about 4-5 pound weight gain. Images to see how this has affected her cholesterol profile. Denies any chest pain or worsening shortness of breath with exertion. She reports no flares of her Crohn's colitis.  I saw Ms. holik back today. She remains asymptomatic. Recently she underwent lifeline screening testing shows mild bilateral carotid artery disease based on peak systolic velocities less than 195 cm/s. She also had a small amount of peripheral arterial disease with a decreased ABIs of 0.9 for the left  and one on the right. C reactive protein was elevated 1.3, which could be consistent with Crohn's. Other studies were normal. She is not known to have coronary disease however this is some evidence that she has peripheral vascular disease and would argue that she needs much more strict cholesterol control. Unfortunately she's been intolerant of any higher doses of cholesterol medication due to myalgias. She is currently on max tolerated dose of cholesterol medication.  Laura Duffy returns today for follow-up. She is without complaints. Blood pressure is well-controlled. She had recent cholesterol check in January by her primary care provider which we will request. She also had lifeline screening which she gets every year and that was stable according to her report. She will send [place] that information. She denies any chest pain or worsening shortness of breath.  12/20/2016  Laura Duffy was seen back today in follow-up. She has no new complaints except she does have some lower extremity swelling. This is been an issue recently. She does have more swelling in the morning which gets better at night when she elevates her feet. She is on amlodipine 10 mg daily for blood pressure control. She also takes low-dose hydrochlorothiazide. 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  03/03/2017  Laura Duffy returns today for follow-up. She reports her leg swellings improve significantly after decreasing her amlodipine to 5 mg daily and increasing her losartan to 100 mg daily. Blood pressure still well controlled 132/72.  She's wearing her compression stockings and she has no significant swelling. In addition her symptoms such as discomfort, achiness and heaviness have improved as well.  10/30/2017  Laura Duffy was seen today in follow-up.  Overall she seems to be doing well.  Recently she was noted to have progressive hyperkalemia.  Blood work over 1 month.  Showed an increase in potassium from  4.5 up to 5.9.  This was associated with an increase in creatinine from normal up to 1.35.  She was referred to nephrology and there continue to work with her on this.  No clear etiology was noted.  She did reports she was eating more than the usual number of cantaloupes, which are high in potassium and wondered if that was associated with it.  She reports good control over her lower extremity swelling and wears compression stockings.  05/22/2018  Laura Duffy returns for follow-up. She is doing well without complaints. Blood pressure is at goal today. She is due for repeat lipid testing. She has Stage 3 CKD- we may need to consider decreasing the dose of her fibrate. She has been on crestor 40 mg daily. Recent LDL from May was 110. Goal LDL <70 with carotid artery disease.  11/21/2018  Laura Duffy is seen today in follow-up.  Overall she is doing well.  Recent lab work showed LDL remains elevated at 103 on Crestor and ezetimibe.  She does have stage III chronic kidney disease.  Her goal LDL is less than 70.  EKG today shows sinus rhythm with first-degree AV block at 82.  She is asymptomatic.  07/01/2019  Laura Duffy was seen today in follow-up.  She did have recent lab work to follow-up on dyslipidemia after starting Repatha.  Unfortunately she did get the medication however the first injector malfunction.  She got a second injector however had had lipid testing and noted marked improvement in her numbers and therefore she did not use the medication.  Her lipid profile from May 2020 showed total cholesterol 164 and LDL of 67.  Again, this is on rosuvastatin, fenofibrate and ezetimibe, however she had not started Repatha.  01/01/2020  Laura Duffy returns today for follow-up.  She continues to do well with her lipids.  She was able to make significant improvement with dietary changes and LDL most recently was 71.  She is on Zetia, fenofibrate and 40 mg of Crestor.  She denies any chest pain or worsening shortness of breath.  Blood  pressure was a little elevated today.  EKG shows sinus rhythm with a first-degree AV block.  06/27/2020  Laura Duffy is seen today in follow-up.  This is a routine visit she is without complaints.  Overall she is doing well.  She denies any chest pain or shortness of breath.  Blood pressure is reasonably well controlled today.  EKG shows sinus rhythm with first-degree AV block.  PMHx:  Past Medical History:  Diagnosis Date   Asthma    Crohn's disease ([facility])    Dyslipidemia    History of DVT (deep vein thrombosis) 2010   Hyperlipidemia    Hypertension     Past Surgical History:  Procedure Laterality Date   APPENDECTOMY     BREAST CYST ASPIRATION Right no date documented   TONSILLECTOMY     TRANSTHORACIC ECHOCARDIOGRAM  05/2010   EF=>55%; normal RVSP; mild mitral annular calcif, trace MR; trace TR; AV mildly sclerotic    FAMHx:  Family History  Problem Relation Age of Onset   Cancer Father  SOCHx:   reports that she has never smoked. She has never used smokeless tobacco. She reports that she does not drink alcohol and does not use drugs.  ALLERGIES:  Allergies  Allergen Reactions   Lisinopril Cough   Penicillins     ROS: Pertinent items noted in HPI and remainder of comprehensive ROS otherwise negative.  HOME MEDS: Current Outpatient Medications  Medication Sig Dispense Refill   amLODipine (NORVASC) 5 MG tablet TAKE 1 TABLET BY MOUTH ONCE DAILY 90 tablet 3   aspirin 81 MG tablet Take 81 mg by mouth daily.     Calcium Carbonate-Vit D-Min (CALCIUM 1200 PO) Take 1 tablet by mouth daily.     cetirizine (ZYRTEC) 10 MG tablet Take 10 mg by mouth daily.     Coenzyme Q10 (CO Q 10) 100 MG CAPS Take 1 capsule by mouth daily.     ezetimibe (ZETIA) 10 MG tablet Take 1 tablet (10 mg total) by mouth daily. 90 tablet 3   fenofibrate (TRICOR) 145 MG tablet Take 1 tablet (145 mg total) by mouth daily. 30 tablet 11   fluticasone (FLONASE) 50 MCG/ACT nasal spray as  needed.     hydrochlorothiazide (HYDRODIURIL) 25 MG tablet Take 1 tablet by mouth daily.     losartan (COZAAR) 100 MG tablet Take 100 mg by mouth daily.     Milk Thistle 250 MG CAPS Take by mouth.     Multiple Vitamin (MULTIVITAMIN) capsule Take 1 capsule by mouth daily.     Niacin (VITAMIN B-3 PO) Take 1,000 mg by mouth daily.     omeprazole (PRILOSEC) 40 MG capsule Take 40 mg by mouth as directed.     rosuvastatin (CRESTOR) 40 MG tablet Take 1 tablet (40 mg total) by mouth daily. 30 tablet 5   sulfaSALAzine (AZULFIDINE) 500 MG tablet Take 2 tablets by mouth 2 (two) times daily.     TEMAZEPAM PO Take 1 tablet by mouth at bedtime.     TURMERIC PO Take by mouth.     vitamin B-12 (CYANOCOBALAMIN) 1000 MCG tablet Take 1,000 mcg by mouth daily.     vitamin C (ASCORBIC ACID) 500 MG tablet Take 1,000 mg by mouth daily.     No current facility-administered medications for this visit.    LABS/IMAGING: No results found for this or any previous visit (from the past 48 hour(s)). No results found.  VITALS: BP (!) 144/72    Pulse 72    Ht 5' 2.5" (1.588 m)    Wt 148 lb 12.8 oz (67.5 kg)    SpO2 92%    BMI 26.78 kg/m   EXAM: General appearance: alert and no distress Neck: no carotid bruit, no JVD and thyroid not enlarged, symmetric, no tenderness/mass/nodules Lungs: clear to auscultation bilaterally Heart: regular rate and rhythm, S1, S2 normal, no murmur, click, rub or gallop Abdomen: soft, non-tender; bowel sounds normal; no masses,  no organomegaly Extremities: extremities normal, atraumatic, no cyanosis or edema and venous stasis dermatitis noted Pulses: 2+ and symmetric Skin: Skin color, texture, turgor normal. No rashes or lesions Neurologic: Grossly normal Psych: Pleasant  EKG: Sinus rhythm first-degree block at 72-personally reviewed  ASSESSMENT: 1. Hypertension-well controlled 2. Dyslipidemia-not at goal, on max dose statin 3. Crohn's colitis 4. History of DVT-no  recurrence on aspirin 5. Mild bilateral carotid artery disease 6. 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 Recent acute kidney injury 8. Hyperkalemia  PLAN: 1.   Mrs. Karim seems to be doing well without any  chest pain or worsening shortness of breath.  Blood pressure is top normal here but better controlled at home.  Weight is down a couple more pounds.  Kidney function appears to be stable with what is reported as stage III chronic kidney disease followed at [facility].  She had carotid Dopplers in March 2021 which appear to be stable bilaterally and mild.  Cholesterol was 67.  No changes to her medicines today.  Follow-up with me annually or sooner as necessary.  Laura Nose, MD, [facility], FACP  [place]   [facility]  Medical Director of the Advanced Lipid Disorders &  Cardiovascular Risk Reduction Clinic Attending Cardiologist  Direct Dial: [phone]   Fax: [phone]  Website:  [email or web address]  Lisette Abu Canna Nickelson 06/26/2020, 2:11 PM

## 2020-06-26 NOTE — Patient Instructions (Addendum)
Medication Instructions:  Your physician recommends that you continue on your current medications as directed. Please refer to the Current Medication list given to you today.  *If you need a refill on your cardiac medications before your next appointment, please call your pharmacy*   Lab Work: FASTING lipid panel to check cholesterol   If you have labs (blood work) drawn today and your tests are completely normal, you will receive your results only by: Marland Kitchen MyChart Message (if you have MyChart) OR . A paper copy in the mail If you have any lab test that is abnormal or we need to change your treatment, we will call you to review the results.   Testing/Procedures: NONE   Follow-Up: At [facility], you and your health needs are our priority.  As part of our continuing mission to provide you with exceptional heart care, we have created designated Provider Care Teams.  These Care Teams include your primary Cardiologist (physician) and Advanced Practice Providers (APPs -  Physician Assistants and Nurse Practitioners) who all work together to provide you with the care you need, when you need it.  We recommend signing up for the patient portal called "MyChart".  Sign up information is provided on this After Visit Summary.  MyChart is used to connect with patients for Virtual Visits (Telemedicine).  Patients are able to view lab/test results, encounter notes, upcoming appointments, etc.  Non-urgent messages can be sent to your provider as well.   To learn more about what you can do with MyChart, go to [email or web address].    Your next appointment:   12 month(s)  The format for your next appointment:   In Person  Provider:   You may see Chrystie Nose, MD or one of the following Advanced Practice Providers on your designated Care Team:    Azalee Course, PA-C  Micah Flesher, [place] or   Judy Pimple, [place]    Other Instructions  [facility] at [facility] [address] [place],  [place]  [address] Main: [phone] Bryon Lions. Abner Greenspan, MD Gwenlyn Found Copland, MD Ander Gaster, MD Donato Schultz, DO Melissa S. Peggyann Juba, NP Wanda Plump, MD Bayard Beaver. [place], PA [employer], [place]

## 2020-06-27 ENCOUNTER — Encounter: Payer: Self-pay | Admitting: Internal Medicine

## 2020-07-09 LAB — LIPID PANEL
Chol/HDL Ratio: 2.8 ratio (ref 0.0–4.4)
Cholesterol, Total: 183 mg/dL (ref 100–199)
HDL: 65 mg/dL (ref >39)
LDL Chol Calc (NIH): 91 mg/dL (ref 0–99)
Triglycerides: 160 mg/dL — ABNORMAL HIGH (ref 0–149)
VLDL Cholesterol Cal: 27 mg/dL (ref 5–40)

## 2020-12-15 ENCOUNTER — Other Ambulatory Visit: Payer: Self-pay | Admitting: Internal Medicine

## 2021-01-20 ENCOUNTER — Ambulatory Visit (HOSPITAL_COMMUNITY)
Admission: RE | Admit: 2021-01-20 | Discharge: 2021-01-20 | Disposition: A | Discharge status: Home / Self Care | Payer: Medicare Other | Source: Ambulatory Visit | Attending: Cardiology | Admitting: Cardiology

## 2021-01-20 ENCOUNTER — Other Ambulatory Visit: Payer: Self-pay

## 2021-01-20 DIAGNOSIS — I6523 Occlusion and stenosis of bilateral carotid arteries: Secondary | ICD-10-CM

## 2021-02-20 ENCOUNTER — Other Ambulatory Visit: Payer: Self-pay | Admitting: Internal Medicine

## 2021-03-25 ENCOUNTER — Other Ambulatory Visit: Payer: Self-pay | Admitting: Physician Assistant

## 2021-03-25 DIAGNOSIS — Z1231 Encounter for screening mammogram for malignant neoplasm of breast: Secondary | ICD-10-CM

## 2021-05-24 ENCOUNTER — Ambulatory Visit
Admission: RE | Admit: 2021-05-24 | Discharge: 2021-05-24 | Disposition: A | Discharge status: Home / Self Care | Payer: Medicare Other | Source: Ambulatory Visit | Attending: Physician Assistant | Admitting: Physician Assistant

## 2021-05-24 ENCOUNTER — Other Ambulatory Visit: Payer: Self-pay

## 2021-05-24 DIAGNOSIS — Z1231 Encounter for screening mammogram for malignant neoplasm of breast: Secondary | ICD-10-CM

## 2021-05-27 ENCOUNTER — Other Ambulatory Visit (HOSPITAL_BASED_OUTPATIENT_CLINIC_OR_DEPARTMENT_OTHER): Payer: Self-pay | Admitting: Internal Medicine

## 2021-05-27 ENCOUNTER — Other Ambulatory Visit (HOSPITAL_BASED_OUTPATIENT_CLINIC_OR_DEPARTMENT_OTHER): Payer: Self-pay | Admitting: Physician Assistant

## 2021-05-27 DIAGNOSIS — Z13820 Encounter for screening for osteoporosis: Secondary | ICD-10-CM

## 2021-05-27 DIAGNOSIS — M8008XA Age-related osteoporosis with current pathological fracture, vertebra(e), initial encounter for fracture: Secondary | ICD-10-CM

## 2021-08-30 ENCOUNTER — Ambulatory Visit: Payer: Medicare Other | Admitting: Internal Medicine

## 2021-09-02 ENCOUNTER — Other Ambulatory Visit: Payer: Self-pay | Admitting: Internal Medicine

## 2021-09-15 ENCOUNTER — Ambulatory Visit: Payer: Medicare Other | Admitting: Internal Medicine

## 2021-10-25 NOTE — Progress Notes (Deleted)
Cardiology Office Note:    Date:  10/25/2021   ID:  Laura Duffy, DOB 1940-12-28, MRN [id number]  PCP:  Delilah Shan, MD  Cardiologist:  Pixie Casino, MD  Electrophysiologist:  None   Referring MD: Delilah Shan, MD   Chief Complaint: follow-up of hypertension and hyperlipidemia  History of Present Illness:    Laura Duffy is a 80 y.o. female with a history of mild carotid stenosis bilaterally, hypertension, hyperlipidemia, prior DVT in 2010 and PE in 10/2020 on Eliquis ***, CKD stage III, and Crohn's disease who is followed by Dr. Debara Pickett and presents today for routine follow-up of hypertension and hyperlipidemia.   Patient follows with Dr. Debara Pickett for primarily hypertension and hyperlipidemia. Remove Echo in 2014 showed normal LV function with no significant valvular disease. Patient underwent lifestyle screening tests around 2015 to early 2016 which showed mild bilateral carotid artery disease based on peak systolic velocities less than 110 cm/s as well as a small amount of PAD with a decreased ABIs of 0.9 on the left and 1.0 on the right.   Patient was last seen by Dr. Debara Pickett in 05/2020 at which time she was doing well with no cardiac complaints. Routine carotid ultrasound was ordered for further evaluation of mild carotid disease. Carotid ultrasound in 12/2020 showed mild 1-39% stenosis of left ICA and only minimal wall thickening or plaque of right ICA. Plan was for repeat ultrasound only if needed.  Since last visit, patient was admitted in 10/2020 at [facility] point for multiple small to moderate size bilateral PE after presenting with shortness of breath for 3 days. There was an elevated RV to LV ration on chest CTA but this was unlikely to be due to right heart strained based on the sizes of the PEs.  Echo at that time showed LVEF of 65-70% with normal wall motion, mildly dilated RV with normal RV systolic function, mild TR, and mild pulmonary hypertension. She was  started on Eliquis. ***  Patient presents today for follow-up. ***  Hypertension BP well controlled. - Continue current medications: Amlodipine 5mg  daily, Losartan 100mg  daily, and HCTZ 25mg  daily.  Hyperlipidemia Lipid panel in 04/2021: Total Cholesterol 155, Triglycerides 135, HDL 62, LDL 75. LDL goal <70. - Continue Crestor 40mg  daily, Zetia 10mg  daily, and Fenofibrate 145mg  daily.  - Labs followed by PCP. LDL slightly above goal. Recommended diet modifications. If LDL higher at follow-up visit, could consider adding bempedoic acid.***  Mild Carotid Disease Most recent carotid ultrasound in 12/2020 showed mild 1-39% stenosis of left ICA and only minimal wall thickening or plaque of right ICA.  - Continue aspirin and statin/Zetia. - Plan is for repeat ultrasound only if needed.  CKD Stage III Baseline creatinine around 1.1 to 1.2. - Stable at last check in 05/2021.  Past Medical History:  Diagnosis Date   Asthma    Crohn's disease ([place])    Dyslipidemia    History of DVT (deep vein thrombosis) 2010   Hyperlipidemia    Hypertension     Past Surgical History:  Procedure Laterality Date   APPENDECTOMY     BREAST CYST ASPIRATION Right no date documented   TONSILLECTOMY     TRANSTHORACIC ECHOCARDIOGRAM  05/2010   EF=>55%; normal RVSP; mild mitral annular calcif, trace MR; trace TR; AV mildly sclerotic    Current Medications: No outpatient medications have been marked as taking for the 11/02/21 encounter (Appointment) with Darreld Mclean, PA-C.     Allergies:  Lisinopril and Penicillins   Social History   Socioeconomic History   Marital status: Widowed    Spouse name: Not on file   Number of children: 0   Years of education: Not on file   Highest education level: Not on file  Occupational History   Not on file  Tobacco Use   Smoking status: Never   Smokeless tobacco: Never  Substance and Sexual Activity   Alcohol use: No   Drug use: No   Sexual activity: Not  on file  Other Topics Concern   Not on file  Social History Narrative   Not on file   Social Determinants of Health   Financial Resource Strain: Not on file  Food Insecurity: Not on file  Transportation Needs: Not on file  Physical Activity: Not on file  Stress: Not on file  Social Connections: Not on file     Family History: The patient's family history includes Cancer in her father.  ROS:   Please see the history of present illness.     EKGs/Labs/Other Studies Reviewed:    The following studies were reviewed today:  Echocardiogram 11/30/2020: Summary: Left ventricular systolic function is normal.  LV ejection fraction = 65-70%.  No segmental wall motion abnormalities seen in the left ventricle  Left ventricular filling pattern is prolonged relaxation.  The right ventricle is mildly dilated.  The right ventricular systolic function is normal.  There is mild tricuspid regurgitation.  Mild pulmonary hypertension.  Estimated right ventricular systolic pressure is 123XX123 mmHg.  There is no comparison study available.  _______________  Carotid Ultrasounds 01/20/2021: Summary:  - Right Carotid: The extracranial vessels were near-normal with only minimal wall thickening or plaque.  - Left Carotid: Velocities in the left ICA are consistent with a 1-39% stenosis. Non-hemodynamically significant plaque <50% noted in the CCA.  - Vertebrals:  Bilateral vertebral arteries demonstrate antegrade flow.  - Subclavians: Normal flow hemodynamics were seen in bilateral subclavian arteries.   EKG:  EKG *** ordered today. EKG personally reviewed and demonstrates ***.  Recent Labs: No results found for requested labs within last 8760 hours.  Recent Lipid Panel    Component Value Date/Time   CHOL 183 07/08/2020 0818   CHOL 200 (H) 11/04/2014 0804   TRIG 160 (H) 07/08/2020 0818   TRIG 214 (H) 11/04/2014 0804   HDL 65 07/08/2020 0818   HDL 55 11/04/2014 0804   CHOLHDL 2.8 07/08/2020  0818   CHOLHDL 3.1 03/31/2015 0805   VLDL 41 (H) 03/31/2015 0805   LDLCALC 91 07/08/2020 0818   LDLCALC 102 (H) 11/04/2014 0804    Physical Exam:    Vital Signs: There were no vitals taken for this visit.    Wt Readings from Last 3 Encounters:  06/26/20 148 lb 12.8 oz (67.5 kg)  01/01/20 150 lb (68 kg)  07/01/19 152 lb (68.9 kg)     General: 80 y.o. female in no acute distress. HEENT: Normocephalic and atraumatic. Sclera clear. EOMs intact. Neck: Supple. No carotid bruits. No JVD. Heart: *** RRR. Distinct S1 and S2. No murmurs, gallops, or rubs. Radial and distal pedal pulses 2+ and equal bilaterally. Lungs: No increased work of breathing. Clear to ausculation bilaterally. No wheezes, rhonchi, or rales.  Abdomen: Soft, non-distended, and non-tender to palpation. Bowel sounds present in all 4 quadrants.  MSK: Normal strength and tone for age. *** Extremities: No lower extremity edema.    Skin: Warm and dry. Neuro: Alert and oriented x3. No focal deficits.  Psych: Normal affect. Responds appropriately.   Assessment:    No diagnosis found.  Plan:     Disposition: Follow up in ***   Medication Adjustments/Labs and Tests Ordered: Current medicines are reviewed at length with the patient today.  Concerns regarding medicines are outlined above.  No orders of the defined types were placed in this encounter.  No orders of the defined types were placed in this encounter.   There are no Patient Instructions on file for this visit.   Signed, Corrin Parker, PA-C  10/25/2021 11:55 AM    [facility]

## 2021-11-01 NOTE — Progress Notes (Signed)
Office Visit    Patient Name: Laura Duffy Date of Encounter: 11/02/2021  Primary Care Provider:  Delilah Shan, MD Primary Cardiologist:  Pixie Casino, MD  Chief Complaint   81 year old female with a history of hypertension, hyperlipidemia, DVT, PE, mild bilateral carotid artery disease, bilateral venous insufficiency, OSA, GERD, CKD stage III, hyperkalemia, chronic anemia, and Crohn's disease who presents for follow-up related to hypertension and hyperlipidemia.   Past Medical History    Past Medical History:  Diagnosis Date   Asthma    Crohn's disease ([place])    Dyslipidemia    History of DVT (deep vein thrombosis) 2010   Hyperlipidemia    Hypertension    Past Surgical History:  Procedure Laterality Date   APPENDECTOMY     BREAST CYST ASPIRATION Right no date documented   TONSILLECTOMY     TRANSTHORACIC ECHOCARDIOGRAM  05/2010   EF=>55%; normal RVSP; mild mitral annular calcif, trace MR; trace TR; AV mildly sclerotic    Allergies  Allergies  Allergen Reactions   Lisinopril Cough   Penicillins     History of Present Illness    81 year old female with the above past medical history including hypertension, hyperlipidemia, DVT, PE, mild bilateral carotid artery disease, bilateral venous insufficiency, OSA, GERD, CKD stage III, hyperkalemia, chronic anemia, and Crohn's disease.   She is followed by Dr. Debara Pickett. Previously on Coumadin for RLE DVT. Echocardiogram in 2011 showed EF >55%, impaired LV relaxation, mild AV sclerosis without stenosis. Most recent carotid dopplers in 12/2020 showed minimal RICA thickening/plaque and 123456 LICA stenosis. She was last seen in the office on 06/26/2020 and was doing well overall from a cardiac standpoint.   She was admitted to [facility] with multiple small to moderate size bilateral PEs in 10/2020. There was an elevated RV to LV ratio on chest CTA, though not thought to be related to R heart strain based on the size  of the PEs. Echo at the time showed EF 65-70%, normal wall motion, midly dialated RV with normal RVSF, mild TR, and mild pulmonary hypertension. She was started on Eliquis.  Since her last visit she has done well overall from a cardiac standpoint.  She does report blood pressures in the low 130s-140s/70s-80s. She states nephrology recently increased her labetalol. She does have mild chronic lower extremity edema for which she wears compression stockings. Otherwise, she has no concerns or complaints today.  Home Medications    Current Outpatient Medications  Medication Sig Dispense Refill   albuterol (VENTOLIN HFA) 108 (90 Base) MCG/ACT inhaler Inhale into the lungs.     amLODipine (NORVASC) 5 MG tablet TAKE 1 TABLET BY MOUTH ONCE DAILY 90 tablet 3   apixaban (ELIQUIS) 5 MG TABS tablet Take 5 mg by mouth 2 (two) times daily.     Calcium Carbonate-Vit D-Min (CALCIUM 1200 PO) Take 1 tablet by mouth daily.     carvedilol (COREG) 25 MG tablet Take 1 tablet (25 mg total) by mouth 2 (two) times daily with a meal. 180 tablet 3   cetirizine (ZYRTEC) 10 MG tablet Take 10 mg by mouth daily.     Coenzyme Q10 (CO Q 10) 100 MG CAPS Take 1 capsule by mouth daily.     ezetimibe (ZETIA) 10 MG tablet Take 1 tablet by mouth once daily 90 tablet 0   fenofibrate (TRICOR) 145 MG tablet Take 1 tablet (145 mg total) by mouth daily. 30 tablet 11   fluticasone (FLONASE) 50 MCG/ACT  nasal spray as needed.     hydrochlorothiazide (HYDRODIURIL) 25 MG tablet Take 1 tablet by mouth daily.     losartan (COZAAR) 100 MG tablet Take 100 mg by mouth daily.     Milk Thistle 250 MG CAPS Take by mouth.     Multiple Vitamin (MULTIVITAMIN) capsule Take 1 capsule by mouth daily.     Niacin (VITAMIN B-3 PO) Take 1,000 mg by mouth daily.     omeprazole (PRILOSEC) 40 MG capsule Take 40 mg by mouth as directed.     rosuvastatin (CRESTOR) 40 MG tablet Take 1 tablet (40 mg total) by mouth daily. 30 tablet 5   sulfaSALAzine (AZULFIDINE) 500  MG tablet Take 2 tablets by mouth 2 (two) times daily.     TEMAZEPAM PO Take 1 tablet by mouth at bedtime.     TURMERIC PO Take by mouth.     vitamin B-12 (CYANOCOBALAMIN) 1000 MCG tablet Take 1,000 mcg by mouth daily.     vitamin C (ASCORBIC ACID) 500 MG tablet Take 1,000 mg by mouth daily.     No current facility-administered medications for this visit.     Review of Systems    He denies chest pain, palpitations, dyspnea, pnd, orthopnea, n, v, dizziness, syncope, edema, weight gain, or early satiety. All other systems reviewed and are otherwise negative except as noted above.   Physical Exam    VS:  BP 130/70 (BP Location: Left Arm, Patient Position: Sitting, Cuff Size: Normal)    Pulse 72    Resp 20    Ht 5\' 2"  (1.575 m)    Wt 142 lb (64.4 kg)    SpO2 91%    BMI 25.97 kg/m  , BMI Body mass index is 25.97 kg/m.  GEN: Well nourished, well developed, in no acute distress. HEENT: normal. Neck: Supple, no JVD, carotid bruits, or masses. Cardiac: RRR, no murmurs, rubs, or gallops. No clubbing, cyanosis, edema.  Radials/DP/PT 2+ and equal bilaterally.  Respiratory:  Respirations regular and unlabored, clear to auscultation bilaterally. GI: Soft, nontender, nondistended, BS + x 4. MS: no deformity or atrophy. Skin: warm and dry, no rash. Neuro:  Strength and sensation are intact. Psych: Normal affect.  Accessory Clinical Findings    ECG personally reviewed by me today - Sinus rhythm with 1st degree heart block, 72 bpm - no acute changes.  No results found for: WBC, HGB, HCT, MCV, PLT No results found for: CREATININE, BUN, NA, K, CL, CO2 Lab Results  Component Value Date   ALT 9 03/31/2015   AST 14 03/31/2015   ALKPHOS 38 (L) 03/31/2015   BILITOT 0.4 03/31/2015   Lab Results  Component Value Date   CHOL 183 07/08/2020   HDL 65 07/08/2020   LDLCALC 91 07/08/2020   TRIG 160 (H) 07/08/2020   CHOLHDL 2.8 07/08/2020    No results found for: HGBA1C  Assessment & Plan    1.  Hypertension: BP slightly above goal.  On high doses of labetalol, and metoprolol succinate as well as amlodipine 5 mg daily, losartan 100 mg daily, and hydrochlorothiazide 25 mg daily.  Heart rate is stable at 72 bpm with first-degree AV block. I will transition her from labetalol and metoprolol to carvedilol 25 mg twice daily to see if this provides better blood pressure control. I advised her to keep a blood pressure log and also monitor her heart rate in case of rebound tachycardia. If blood pressure or heart rate remain elevated, could consider increasing carvedilol.  Otherwise, continue current antihypertensive regimen.   2. Hyperlipidemia: LDL 75 on 05/12/2021.  Continue Crestor 40 mg daily, Zetia 10 mg daily, and fenofibrate.  3. Mild bilateral carotid artery disease: Most recent carotid dopplers in 12/2020 showed minimal RICA thickening/plaque and 123456 LICA stenosis. Repeat dopplers as needed.  Continue Crestor, Zetia and fenofibrate as above. Given chronic DOAC use as below, will stop ASA given increased bleeding risk.   4. History of DVT/PE: On Eliquis indefinitely.  She denies chest pain, shortness of breath, lower extremity edema.  Heart rate stable, 72 bpm. No signs of bleeding on Eliquis. Managed per PCP.   5. CKD stage III/chronic anemia: Followed by nephrology at [facility] [place].  She has also been referred to hematology but has not seen them yet, last Hgb stable at 10.2.  Baseline creatinine around 1.1-1.2, stable at 1.15 in 05/2021.   6. Disposition: Follow-up in 1 year.  Lenna Sciara, NP 11/02/2021, 2:58 PM

## 2021-11-02 ENCOUNTER — Other Ambulatory Visit: Payer: Self-pay

## 2021-11-02 ENCOUNTER — Ambulatory Visit (INDEPENDENT_AMBULATORY_CARE_PROVIDER_SITE_OTHER): Payer: Medicare Other | Admitting: Nurse Practitioner

## 2021-11-02 ENCOUNTER — Encounter: Payer: Self-pay | Admitting: Student

## 2021-11-02 VITALS — BP 130/70 | HR 72 | Resp 20 | Ht 62.0 in | Wt 142.0 lb

## 2021-11-02 DIAGNOSIS — I10 Essential (primary) hypertension: Secondary | ICD-10-CM

## 2021-11-02 DIAGNOSIS — Z86711 Personal history of pulmonary embolism: Secondary | ICD-10-CM | POA: Diagnosis not present

## 2021-11-02 DIAGNOSIS — E782 Mixed hyperlipidemia: Secondary | ICD-10-CM | POA: Diagnosis not present

## 2021-11-02 DIAGNOSIS — I6529 Occlusion and stenosis of unspecified carotid artery: Secondary | ICD-10-CM

## 2021-11-02 DIAGNOSIS — D649 Anemia, unspecified: Secondary | ICD-10-CM

## 2021-11-02 DIAGNOSIS — N1831 Chronic kidney disease, stage 3a: Secondary | ICD-10-CM

## 2021-11-02 MED ORDER — CARVEDILOL 25 MG PO TABS
25.0000 mg | ORAL_TABLET | Freq: Two times a day (BID) | ORAL | 3 refills | Status: AC
Start: 2021-11-02 — End: ?

## 2021-11-02 NOTE — Patient Instructions (Signed)
Medication Instructions:  STOP Metoprolol Succinate (Toprol) STOP Labetalol  STOP Aspirin  START Carvedilol (Coreg) 25 mg 2 times a day  *If you need a refill on your cardiac medications before your next appointment, please call your pharmacy*  Lab Work: NONE ordered at this time of appointment   If you have labs (blood work) drawn today and your tests are completely normal, you will receive your results only by: MyChart Message (if you have MyChart) OR A paper copy in the mail If you have any lab test that is abnormal or we need to change your treatment, we will call you to review the results.  Testing/Procedures: NONE ordered at this time of appointment   Follow-Up: At [facility], you and your health needs are our priority.  As part of our continuing mission to provide you with exceptional heart care, we have created designated Provider Care Teams.  These Care Teams include your primary Cardiologist (physician) and Advanced Practice Providers (APPs -  Physician Assistants and Nurse Practitioners) who all work together to provide you with the care you need, when you need it.   Your next appointment:   1 year(s)  The format for your next appointment:   In Person  Provider:   Chrystie Nose, MD     Other Instructions Monitor blood pressure and heart rate. If blood pressure is persistently greater than 130/80 or if your heart rate (pulse) is persistently greater than 100 BPM or less than 50 BPM please give our office a call

## 2021-12-01 ENCOUNTER — Other Ambulatory Visit: Payer: Self-pay | Admitting: Internal Medicine

## 2022-04-12 ENCOUNTER — Other Ambulatory Visit: Payer: Self-pay | Admitting: Physician Assistant

## 2022-04-12 DIAGNOSIS — Z1231 Encounter for screening mammogram for malignant neoplasm of breast: Secondary | ICD-10-CM

## 2022-05-25 ENCOUNTER — Ambulatory Visit
Admission: RE | Admit: 2022-05-25 | Discharge: 2022-05-25 | Disposition: A | Discharge status: Home / Self Care | Payer: Medicare Other | Source: Ambulatory Visit | Attending: Physician Assistant | Admitting: Physician Assistant

## 2022-05-25 ENCOUNTER — Ambulatory Visit: Payer: Medicare Other

## 2022-05-25 DIAGNOSIS — Z1231 Encounter for screening mammogram for malignant neoplasm of breast: Secondary | ICD-10-CM

## 2022-11-17 ENCOUNTER — Other Ambulatory Visit: Payer: Self-pay | Admitting: Internal Medicine

## 2023-01-21 ENCOUNTER — Other Ambulatory Visit: Payer: Self-pay | Admitting: Internal Medicine

## 2023-02-07 ENCOUNTER — Other Ambulatory Visit: Payer: Self-pay | Admitting: Internal Medicine

## 2023-02-08 ENCOUNTER — Ambulatory Visit: Payer: Medicare Other | Attending: Internal Medicine | Admitting: Internal Medicine

## 2023-04-12 ENCOUNTER — Other Ambulatory Visit: Payer: Self-pay | Admitting: Physician Assistant

## 2023-04-12 DIAGNOSIS — Z1231 Encounter for screening mammogram for malignant neoplasm of breast: Secondary | ICD-10-CM

## 2023-06-12 ENCOUNTER — Ambulatory Visit
Admission: RE | Admit: 2023-06-12 | Discharge: 2023-06-12 | Disposition: A | Discharge status: Home / Self Care | Payer: Medicare Other | Source: Ambulatory Visit | Attending: Physician Assistant | Admitting: Physician Assistant

## 2023-06-12 DIAGNOSIS — Z1231 Encounter for screening mammogram for malignant neoplasm of breast: Secondary | ICD-10-CM

## 2023-06-21 ENCOUNTER — Other Ambulatory Visit: Payer: Self-pay | Admitting: Internal Medicine

## 2023-06-28 NOTE — Progress Notes (Signed)
Cardiology Clinic Note   Patient Name: Laura Duffy Date of Encounter: 06/30/2023  Primary Care Provider:  Wilburn Mylar, MD Primary Cardiologist:  Chrystie Nose, MD  Patient Profile    82 year-old female with a history of hypertension, hyperlipidemia, DVT, PE (2022), on Eliquis, mild bilateral carotid artery disease, bilateral venous insufficiency, OSA, GERD, CKD stage III, hyperkalemia, chronic anemia, and Crohn's disease.   Last seen int the office by Chestine Spore for HTN and Hyperlipidemia.  At that office visit she was taken off of labetalol and metoprolol and changed to carvedilol 25 mg twice daily for better blood pressure control.  She was to keep a blood pressure log and to call if blood pressure escalated.  Aspirin was discontinued.  She was to follow-up in 1 year.  Past Medical History    Past Medical History:  Diagnosis Date   Asthma    Crohn's disease (HCC)    Dyslipidemia    History of DVT (deep vein thrombosis) 2010   Hyperlipidemia    Hypertension    Past Surgical History:  Procedure Laterality Date   APPENDECTOMY     BREAST CYST ASPIRATION Right no date documented   TONSILLECTOMY     TRANSTHORACIC ECHOCARDIOGRAM  05/2010   EF=>55%; normal RVSP; mild mitral annular calcif, trace MR; trace TR; AV mildly sclerotic    Allergies  Allergies  Allergen Reactions   Lisinopril Cough   Penicillins     History of Present Illness    [name] returns to the office today for ongoing assessment management of hypertension, hyperlipidemia, with history of chronic kidney disease stage III, DVT/PE on Eliquis, and mild bilateral carotid artery disease.  Medications were adjusted on last office visit on 11/02/2021 and she was to follow-up in 1 year for ongoing evaluations.  She comes today without any cardiac complaints.  The patient remains active, she denies any bleeding or excessive bruising on Eliquis.  She travels a lot and has been enjoying her retirement.   She is not on any purposeful exercise regimen but remains active in her home taking care of her self her yard and traveling walking through different destinations.  She denies any chest pain, dyspnea on exertion, fatigue, or dizziness.  She is medically compliant.  Unfortunately the patient had not taken her antihypertensives prior to coming to the office today.    Home Medications    Current Outpatient Medications  Medication Sig Dispense Refill   albuterol (VENTOLIN HFA) 108 (90 Base) MCG/ACT inhaler Inhale into the lungs.     amLODipine (NORVASC) 5 MG tablet TAKE 1 TABLET BY MOUTH ONCE DAILY 90 tablet 3   apixaban (ELIQUIS) 5 MG TABS tablet Take 5 mg by mouth 2 (two) times daily.     Calcium Carbonate-Vit D-Min (CALCIUM 1200 PO) Take 1 tablet by mouth daily.     cetirizine (ZYRTEC) 10 MG tablet Take 10 mg by mouth daily.     chlorthalidone (HYGROTON) 25 MG tablet Take 25 mg by mouth daily.     Coenzyme Q10 (CO Q 10) 100 MG CAPS Take 1 capsule by mouth daily.     fenofibrate (TRICOR) 145 MG tablet Take 1 tablet (145 mg total) by mouth daily. 30 tablet 11   fluticasone (FLONASE) 50 MCG/ACT nasal spray as needed.     hydrALAZINE (APRESOLINE) 10 MG tablet Take 10 mg by mouth 3 (three) times daily.     hydrochlorothiazide (HYDRODIURIL) 25 MG tablet Take 1 tablet by mouth daily.  labetalol (NORMODYNE) 300 MG tablet Take 300 mg by mouth 2 (two) times daily.     losartan (COZAAR) 100 MG tablet Take 100 mg by mouth daily.     Milk Thistle 250 MG CAPS Take by mouth.     Multiple Vitamin (MULTIVITAMIN) capsule Take 1 capsule by mouth daily.     Niacin (VITAMIN B-3 PO) Take 1,000 mg by mouth daily.     omeprazole (PRILOSEC) 40 MG capsule Take 40 mg by mouth as directed.     rosuvastatin (CRESTOR) 40 MG tablet Take 1 tablet (40 mg total) by mouth daily. 30 tablet 5   sulfaSALAzine (AZULFIDINE) 500 MG tablet Take 2 tablets by mouth 2 (two) times daily.     TEMAZEPAM PO Take 1 tablet by mouth at  bedtime.     TURMERIC PO Take by mouth.     vitamin B-12 (CYANOCOBALAMIN) 1000 MCG tablet Take 1,000 mcg by mouth daily.     vitamin C (ASCORBIC ACID) 500 MG tablet Take 1,000 mg by mouth daily.     carvedilol (COREG) 25 MG tablet Take 1 tablet (25 mg total) by mouth 2 (two) times daily with a meal. 180 tablet 3   ezetimibe (ZETIA) 10 MG tablet Take 1 tablet (10 mg total) by mouth daily. 90 tablet 3   No current facility-administered medications for this visit.     Family History    Family History  Problem Relation Age of Onset   Cancer Father    She indicated that her mother is deceased. She indicated that her father is deceased. She indicated that her sister is alive. She indicated that both of her brothers are alive. She indicated that her maternal grandmother is deceased. She indicated that her maternal grandfather is deceased. She indicated that her paternal grandmother is deceased. She indicated that her paternal grandfather is deceased.  Social History    Social History   Socioeconomic History   Marital status: Widowed    Spouse name: Not on file   Number of children: 0   Years of education: Not on file   Highest education level: Not on file  Occupational History   Not on file  Tobacco Use   Smoking status: Never   Smokeless tobacco: Never  Substance and Sexual Activity   Alcohol use: No   Drug use: No   Sexual activity: Not on file  Other Topics Concern   Not on file  Social History Narrative   Not on file   Social Determinants of Health   Financial Resource Strain: Not on file  Food Insecurity: No Food Insecurity (07/11/2022)   [facility] prior to 12/31/2022.   Hunger Vital Sign    Worried About Running Out of Food in the Last Year: Never true    Ran Out of Food in the Last Year: Never true  Transportation Needs: No  Transportation Needs (07/11/2022)   [facility] to 12/31/2022[facility] prior to 12/31/2022.   PRAPARE - Administrator, Civil Service (Medical): No    Lack of Transportation (Non-Medical): No  Physical Activity: Not on file  Stress: Not on file  Social Connections: Not on file  Intimate Partner Violence: Not on file     Review of Systems    General:  No chills,  fever, night sweats or weight changes.  Cardiovascular:  No chest pain, dyspnea on exertion, edema, orthopnea, palpitations, paroxysmal nocturnal dyspnea. Dermatological: No rash, lesions/masses Respiratory: No cough, dyspnea Urologic: No hematuria, dysuria Abdominal:   No nausea, vomiting, diarrhea, bright red blood per rectum, melena, or hematemesis Neurologic:  No visual changes, wkns, changes in mental status. All other systems reviewed and are otherwise negative except as noted above.  EKG Interpretation Date/Time:  Friday June 30 2023 10:34:19 EDT Ventricular Rate:  63 PR Interval:  242 QRS Duration:  78 QT Interval:  402 QTC Calculation: 411 R Axis:   86  Text Interpretation: Sinus rhythm with 1st degree A-V block No previous ECGs available Confirmed by Joni Reining [phone]) on 06/30/2023 12:53:39 PM    Physical Exam    VS:  BP (!) 180/86 (BP Location: Left Arm, Patient Position: Sitting, Cuff Size: Normal)   Pulse 63   Ht 5\' 2"  (1.575 m)   Wt 137 lb 12.8 oz (62.5 kg)   SpO2 94%   BMI 25.20 kg/m  , BMI Body mass index is 25.2 kg/m.     GEN: Well nourished, well developed, in no acute distress. HEENT: normal. Neck: Supple, no JVD, soft carotid bruits, or masses. Cardiac: RRR, no murmurs, rubs, or gallops. No clubbing, cyanosis, edema.  Radials/DP/PT 2+ and equal bilaterally.  Varicosities noted bilaterally. Respiratory:  Respirations regular and unlabored, clear to auscultation  bilaterally. GI: Soft, nontender, nondistended, BS + x 4. MS: no deformity or atrophy. Skin: warm and dry, no rash. Neuro:  Strength and sensation are intact. Psych: Normal affect.  EKG Interpretation Date/Time:  Friday June 30 2023 10:34:19 EDT Ventricular Rate:  63 PR Interval:  242 QRS Duration:  78 QT Interval:  402 QTC Calculation: 411 R Axis:   86  Text Interpretation: Sinus rhythm with 1st degree A-V block No previous ECGs available Confirmed by Joni Reining [phone]) on 06/30/2023 12:53:39 PM   No results found for: "WBC", "HGB", "HCT", "MCV", "PLT" No results found for: "CREATININE", "BUN", "NA", "K", "CL", "CO2" Lab Results  Component Value Date   ALT 9 03/31/2015   AST 14 03/31/2015   ALKPHOS 38 (L) 03/31/2015   BILITOT 0.4 03/31/2015   Lab Results  Component Value Date   CHOL 183 07/08/2020   HDL 65 07/08/2020   LDLCALC 91 07/08/2020   TRIG 160 (H) 07/08/2020   CHOLHDL 2.8 07/08/2020    No results found for: "HGBA1C"   Review of Prior Studies EKG Interpretation Date/Time:  Friday June 30 2023 10:34:19 EDT Ventricular Rate:  63 PR Interval:  242 QRS Duration:  78 QT Interval:  402 QTC Calculation: 411 R Axis:   86  Text Interpretation: Sinus rhythm with 1st degree A-V block No previous ECGs available Confirmed by Joni Reining [phone]) on 06/30/2023 12:53:39 PM  Carotid Ultrasound 12/02/2020 Summary:  Right Carotid: The extracranial vessels were near-normal with only minimal  wall thickening or plaque.   Left Carotid: Velocities in the left ICA are consistent with a 1-39%  stenosis.  Non-hemodynamically significant plaque <50% noted in the  CCA.   Vertebrals: Bilateral vertebral arteries demonstrate antegrade flow.  Subclavians: Normal flow hemodynamics were seen in bilateral subclavian               arteries.    Assessment & Plan   1.  Hypertension: Blood pressure is very elevated today.  She has not taken her antihypertensive  medications prior to come to the office visit as  she had an early morning meeting and left her medications at home.  She is advised to take the antihypertensives as soon as possible on returning home.  She reports that her blood pressure is labile at home running in the 120s over 60s as high as the 140s over 80s.  She is asymptomatic with this.  On follow-up visit I would recommend repeating echocardiogram for evaluation of LV function, LVH, and diastolic function in the setting of ongoing hypertension.  She is on multiple antihypertensives.  2.  Hyperlipidemia: Remains on rosuvastatin 40 mg daily.  The patient is being followed by PCP and has labs drawn by them.  Labs dated 03/22/2023 LDL 71.  Total cholesterol 164, triglycerides 133, HDL 72.  3.  Chronic kidney disease: Stage III.  Continue to follow labs per primary care.  Most recent labs drawn 03/22/2023 GFR 48, creatinine 1.14.        Signed, Bettey Mare. Liborio Nixon, ANP, AACC   06/30/2023 12:54 PM      Office [phone] Fax [phone]  Notice: This dictation was prepared with Dragon dictation along with smaller phrase technology. Any transcriptional errors that result from this process are unintentional and may not be corrected upon review.

## 2023-06-30 ENCOUNTER — Encounter: Payer: Self-pay | Admitting: Adult Health

## 2023-06-30 ENCOUNTER — Ambulatory Visit: Payer: Medicare Other | Attending: Adult Health | Admitting: Adult Health

## 2023-06-30 VITALS — BP 180/86 | HR 63 | Ht 62.0 in | Wt 137.8 lb

## 2023-06-30 DIAGNOSIS — Z86718 Personal history of other venous thrombosis and embolism: Secondary | ICD-10-CM

## 2023-06-30 DIAGNOSIS — N1831 Chronic kidney disease, stage 3a: Secondary | ICD-10-CM

## 2023-06-30 DIAGNOSIS — E782 Mixed hyperlipidemia: Secondary | ICD-10-CM

## 2023-06-30 DIAGNOSIS — I10 Essential (primary) hypertension: Secondary | ICD-10-CM | POA: Diagnosis present

## 2023-06-30 MED ORDER — EZETIMIBE 10 MG PO TABS: 10.0000 mg | ORAL_TABLET | Freq: Every day | ORAL | 3 refills | Status: DC

## 2023-06-30 NOTE — Patient Instructions (Signed)
Medication Instructions:  No Changes *If you need a refill on your cardiac medications before your next appointment, please call your pharmacy*   Lab Work: No Labs If you have labs (blood work) drawn today and your tests are completely normal, you will receive your results only by: [place] (if you have MyChart) OR A paper copy in the mail If you have any lab test that is abnormal or we need to change your treatment, we will call you to review the results.   Testing/Procedures: No Testing   Follow-Up: At [facility], you and your health needs are our priority.  As part of our continuing mission to provide you with exceptional heart care, we have created designated Provider Care Teams.  These Care Teams include your primary Cardiologist (physician) and Advanced Practice Providers (APPs -  Physician Assistants and Nurse Practitioners) who all work together to provide you with the care you need, when you need it.  We recommend signing up for the patient portal called "MyChart".  Sign up information is provided on this After Visit Summary.  MyChart is used to connect with patients for Virtual Visits (Telemedicine).  Patients are able to view lab/test results, encounter notes, upcoming appointments, etc.  Non-urgent messages can be sent to your provider as well.   To learn more about what you can do with MyChart, go to [email or web address].    Your next appointment:   6 month(s)  Provider:   Pixie Casino, MD

## 2024-01-12 ENCOUNTER — Ambulatory Visit: Payer: Medicare Other | Attending: Internal Medicine | Admitting: Internal Medicine

## 2024-01-12 ENCOUNTER — Encounter: Payer: Self-pay | Admitting: Internal Medicine

## 2024-01-12 VITALS — BP 140/68 | HR 62 | Ht 62.0 in | Wt 135.4 lb

## 2024-01-12 DIAGNOSIS — Z86718 Personal history of other venous thrombosis and embolism: Secondary | ICD-10-CM | POA: Diagnosis present

## 2024-01-12 DIAGNOSIS — E785 Hyperlipidemia, unspecified: Secondary | ICD-10-CM | POA: Diagnosis present

## 2024-01-12 DIAGNOSIS — I10 Essential (primary) hypertension: Secondary | ICD-10-CM | POA: Diagnosis present

## 2024-01-12 DIAGNOSIS — Z7901 Long term (current) use of anticoagulants: Secondary | ICD-10-CM | POA: Diagnosis present

## 2024-01-12 DIAGNOSIS — R0989 Other specified symptoms and signs involving the circulatory and respiratory systems: Secondary | ICD-10-CM | POA: Insufficient documentation

## 2024-01-12 NOTE — Patient Instructions (Signed)
 Medication Instructions:  NO CHANGES  *If you need a refill on your cardiac medications before your next appointment, please call your pharmacy*  Testing/Procedures: Your physician has requested that you have a carotid duplex. This test is an ultrasound of the carotid arteries in your neck. It looks at blood flow through these arteries that supply the brain with blood. Allow one hour for this exam. There are no restrictions or special instructions.  Follow-Up: At [facility], you and your health needs are our priority.  As part of our continuing mission to provide you with exceptional heart care, we have created designated Provider Care Teams.  These Care Teams include your primary Cardiologist (physician) and Advanced Practice Providers (APPs -  Physician Assistants and Nurse Practitioners) who all work together to provide you with the care you need, when you need it.  We recommend signing up for the patient portal called "MyChart".  Sign up information is provided on this After Visit Summary.  MyChart is used to connect with patients for Virtual Visits (Telemedicine).  Patients are able to view lab/test results, encounter notes, upcoming appointments, etc.  Non-urgent messages can be sent to your provider as well.   To learn more about what you can do with MyChart, go to [email or web address].    Your next appointment:   12 months with Dr. Rennis Golden or NP/PA  Other Instructions   1st Floor: - Lobby - Registration  - Pharmacy  - Lab - Cafe  2nd Floor: - PV Lab - Diagnostic Testing (echo, CT, nuclear med)  3rd Floor: - Vacant  4th Floor: - TCTS (cardiothoracic surgery) - AFib Clinic - Structural Heart Clinic - Vascular Surgery  - Vascular Ultrasound  5th Floor: - HeartCare Cardiology (general and EP) - Clinical Pharmacy for coumadin, hypertension, lipid, weight-loss medications, and med management appointments    Valet parking services will be available as well.

## 2024-01-12 NOTE — Progress Notes (Signed)
 OFFICE NOTE  Chief Complaint:  Routine follow-up  Primary Care Physician: Wilburn Mylar, MD  HPI:  Laura Duffy is a 83 year old female previously followed by Dr. Lynnea Ferrier with a history of DVT in 2010. She was on Coumadin for a while, however, since has been taken off of that. She also has a history of Crohn disease and dyslipidemia. Recently we performed another ultrasound of her legs which showed resolution of the DVT in the right lower extremity. With regards to her cholesterol, she had a lipid profile performed by me with NMR which showed a total particle number of 2064, total cholesterol of 206, triglycerides 237, HDL 55, and LDL 104. I recommended increasing her Crestor to 40 mg daily and she did that for a few weeks but noted some muscle pains and aches and then decreased it back to 20 mg. She has worked on also changing her diet, and she has had some improvement in her cholesterol profile. Today told cholesterol is now 192, triglycerides 219, LDL of 100, HDL of 48. However, her calculated LDL by NMR was 77 with an LDL particle number of 1531, down from about 2000. This indicates significant progress as well as reduction in her risk of coronary events. Her previous LPIR score, which is a global calculation of risk based on lipoprotein profile, was 83 and is currently 78.  Today she has no specific complaints. She's been doing a little less exercise than she had previously and has had about 4-5 pound weight gain. Images to see how this has affected her cholesterol profile. Denies any chest pain or worsening shortness of breath with exertion. She reports no flares of her Crohn's colitis.  I saw [name] back today. She remains asymptomatic. Recently she underwent lifeline screening testing shows mild bilateral carotid artery disease based on peak systolic velocities less than 536 cm/s. She also had a small amount of peripheral arterial disease with a decreased ABIs of 0.9 for the left  and one on the right. C reactive protein was elevated 1.3, which could be consistent with Crohn's. Other studies were normal. She is not known to have coronary disease however this is some evidence that she has peripheral vascular disease and would argue that she needs much more strict cholesterol control. Unfortunately she's been intolerant of any higher doses of cholesterol medication due to myalgias. She is currently on max tolerated dose of cholesterol medication.  [name] returns today for follow-up. She is without complaints. Blood pressure is well-controlled. She had recent cholesterol check in January by her primary care provider which we will request. She also had lifeline screening which she gets every year and that was stable according to her report. She will send [place] that information. She denies any chest pain or worsening shortness of breath.  12/20/2016  Laura Duffy was seen back today in follow-up. She has no new complaints except she does have some lower extremity swelling. This is been an issue recently. She does have more swelling in the morning which gets better at night when she elevates her feet. She is on amlodipine 10 mg daily for blood pressure control. She also takes low-dose hydrochlorothiazide. 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  03/03/2017  Laura Duffy returns today for follow-up. She reports her leg swellings improve significantly after decreasing her amlodipine to 5 mg daily and increasing her losartan to 100 mg daily. Blood pressure still well controlled 132/72.  She's wearing her compression stockings and she has no significant swelling. In addition her symptoms such as discomfort, achiness and heaviness have improved as well.  10/30/2017  Laura Duffy was seen today in follow-up.  Overall she seems to be doing well.  Recently she was noted to have progressive hyperkalemia.  Blood work over 1 month.  Showed an increase in potassium from  4.5 up to 5.9.  This was associated with an increase in creatinine from normal up to 1.35.  She was referred to nephrology and there continue to work with her on this.  No clear etiology was noted.  She did reports she was eating more than the usual number of cantaloupes, which are high in potassium and wondered if that was associated with it.  She reports good control over her lower extremity swelling and wears compression stockings.  05/22/2018  Laura Duffy returns for follow-up. She is doing well without complaints. Blood pressure is at goal today. She is due for repeat lipid testing. She has Stage 3 CKD- we may need to consider decreasing the dose of her fibrate. She has been on crestor 40 mg daily. Recent LDL from May was 110. Goal LDL <70 with carotid artery disease.  11/21/2018  Laura Duffy is seen today in follow-up.  Overall she is doing well.  Recent lab work showed LDL remains elevated at 103 on Crestor and ezetimibe.  She does have stage III chronic kidney disease.  Her goal LDL is less than 70.  EKG today shows sinus rhythm with first-degree AV block at 82.  She is asymptomatic.  07/01/2019  Laura Duffy was seen today in follow-up.  She did have recent lab work to follow-up on dyslipidemia after starting Repatha.  Unfortunately she did get the medication however the first injector malfunction.  She got a second injector however had had lipid testing and noted marked improvement in her numbers and therefore she did not use the medication.  Her lipid profile from May 2020 showed total cholesterol 164 and LDL of 67.  Again, this is on rosuvastatin, fenofibrate and ezetimibe, however she had not started Repatha.  01/01/2020  Laura Duffy returns today for follow-up.  She continues to do well with her lipids.  She was able to make significant improvement with dietary changes and LDL most recently was 71.  She is on Zetia, fenofibrate and 40 mg of Crestor.  She denies any chest pain or worsening shortness of breath.  Blood  pressure was a little elevated today.  EKG shows sinus rhythm with a first-degree AV block.  06/27/2020  Laura Duffy is seen today in follow-up.  This is a routine visit she is without complaints.  Overall she is doing well.  She denies any chest pain or shortness of breath.  Blood pressure is reasonably well controlled today.  EKG shows sinus rhythm with first-degree AV block.  01/12/2024  Laura Duffy is seen today in follow-up.  She is asymptomatic and doing well.  She denies any chest pain or shortness of breath.  She reports her recent lab work shows improvement in her kidney function.  She had lipids in May which showed total cholesterol 164, triglycerides 133, HDL 72 and LDL 71.  She has a history of mild right carotid and minimal left carotid artery stenosis with Dopplers last in 2022.  PMHx:  Past Medical History:  Diagnosis Date   Asthma    Crohn's disease (HCC)    Dyslipidemia    History of DVT (deep vein thrombosis) 2010  Hyperlipidemia    Hypertension     Past Surgical History:  Procedure Laterality Date   APPENDECTOMY     BREAST CYST ASPIRATION Right no date documented   TONSILLECTOMY     TRANSTHORACIC ECHOCARDIOGRAM  05/2010   EF=>55%; normal RVSP; mild mitral annular calcif, trace MR; trace TR; AV mildly sclerotic    FAMHx:  Family History  Problem Relation Age of Onset   Cancer Father     SOCHx:   reports that she has never smoked. She has never used smokeless tobacco. She reports that she does not drink alcohol and does not use drugs.  ALLERGIES:  Allergies  Allergen Reactions   Lisinopril Cough   Penicillins     ROS: Pertinent items noted in HPI and remainder of comprehensive ROS otherwise negative.  HOME MEDS: Current Outpatient Medications  Medication Sig Dispense Refill   albuterol (VENTOLIN HFA) 108 (90 Base) MCG/ACT inhaler Inhale into the lungs.     amLODipine (NORVASC) 5 MG tablet TAKE 1 TABLET BY MOUTH ONCE DAILY 90 tablet 3   apixaban (ELIQUIS) 5  MG TABS tablet Take 5 mg by mouth 2 (two) times daily.     Calcium Carbonate-Vit D-Min (CALCIUM 1200 PO) Take 1 tablet by mouth daily.     cetirizine (ZYRTEC) 10 MG tablet Take 10 mg by mouth daily.     chlorthalidone (HYGROTON) 25 MG tablet Take 25 mg by mouth daily.     Coenzyme Q10 (CO Q 10) 100 MG CAPS Take 1 capsule by mouth daily.     fenofibrate (TRICOR) 145 MG tablet Take 1 tablet (145 mg total) by mouth daily. 30 tablet 11   fluticasone (FLONASE) 50 MCG/ACT nasal spray as needed.     hydrALAZINE (APRESOLINE) 10 MG tablet Take 10 mg by mouth 3 (three) times daily.     hydrochlorothiazide (HYDRODIURIL) 25 MG tablet Take 1 tablet by mouth daily.     labetalol (NORMODYNE) 300 MG tablet Take 300 mg by mouth 2 (two) times daily.     losartan (COZAAR) 100 MG tablet Take 100 mg by mouth daily.     Milk Thistle 250 MG CAPS Take by mouth.     Multiple Vitamin (MULTIVITAMIN) capsule Take 1 capsule by mouth daily.     Niacin (VITAMIN B-3 PO) Take 1,000 mg by mouth daily.     omeprazole (PRILOSEC) 40 MG capsule Take 40 mg by mouth as directed.     rosuvastatin (CRESTOR) 40 MG tablet Take 1 tablet (40 mg total) by mouth daily. 30 tablet 5   sulfaSALAzine (AZULFIDINE) 500 MG tablet Take 2 tablets by mouth 2 (two) times daily.     TEMAZEPAM PO Take 1 tablet by mouth at bedtime.     TURMERIC PO Take by mouth.     vitamin B-12 (CYANOCOBALAMIN) 1000 MCG tablet Take 1,000 mcg by mouth daily.     vitamin C (ASCORBIC ACID) 500 MG tablet Take 1,000 mg by mouth daily.     carvedilol (COREG) 25 MG tablet Take 1 tablet (25 mg total) by mouth 2 (two) times daily with a meal. (Patient not taking: Reported on 01/12/2024) 180 tablet 3   ezetimibe (ZETIA) 10 MG tablet Take 1 tablet (10 mg total) by mouth daily. 90 tablet 3   No current facility-administered medications for this visit.    LABS/IMAGING: No results found for this or any previous visit (from the past 48 hours). No results found.  VITALS: BP (!)  140/68 (BP Location: Left Arm,  Patient Position: Sitting, Cuff Size: Normal)   Pulse 62   Ht 5\' 2"  (1.575 m)   Wt 135 lb 6.4 oz (61.4 kg)   SpO2 97%   BMI 24.76 kg/m   EXAM: General appearance: alert and no distress Neck: no JVD, thyroid not enlarged, symmetric, no tenderness/mass/nodules, and right carotid bruit Lungs: clear to auscultation bilaterally Heart: regular rate and rhythm, S1, S2 normal, no murmur, click, rub or gallop Abdomen: soft, non-tender; bowel sounds normal; no masses,  no organomegaly Extremities: extremities normal, atraumatic, no cyanosis or edema and venous stasis dermatitis noted Pulses: 2+ and symmetric Skin: Skin color, texture, turgor normal. 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   Laura Duffy did to have a right carotid bruit on exam today.  She did have some mild carotid disease a little worse in the right than the left back in 2022.  Will plan repeat carotid Dopplers as I suspect this may have progressed.  Her LDL is very near target on multiple medications.  She is on Eliquis with no bleeding issues and a history of DVT without recurrence.  She denies any chest pain or shortness of breath.  Blood pressure runs between 130 and 140 systolic at home.  Follow-up with APP annually or sooner as  necessary.  Chrystie Nose, MD, [facility], FACP  [place]  [facility]  Medical Director of the [facility] Attending Cardiologist  Direct Dial: [phone]  Fax: [phone]  Website:  [email or web address] Nicely Jedrek Dinovo 01/12/2024, 9:54 AM

## 2024-01-31 ENCOUNTER — Ambulatory Visit (HOSPITAL_COMMUNITY)
Admission: RE | Admit: 2024-01-31 | Discharge: 2024-01-31 | Disposition: A | Discharge status: Home / Self Care | Source: Ambulatory Visit | Attending: Internal Medicine | Admitting: Internal Medicine

## 2024-01-31 DIAGNOSIS — R0989 Other specified symptoms and signs involving the circulatory and respiratory systems: Secondary | ICD-10-CM | POA: Insufficient documentation

## 2024-02-05 ENCOUNTER — Other Ambulatory Visit (HOSPITAL_COMMUNITY): Payer: Self-pay | Admitting: *Deleted

## 2024-02-05 DIAGNOSIS — I6523 Occlusion and stenosis of bilateral carotid arteries: Secondary | ICD-10-CM

## 2024-04-30 ENCOUNTER — Other Ambulatory Visit: Payer: Self-pay | Admitting: Physician Assistant

## 2024-04-30 DIAGNOSIS — Z0000 Encounter for general adult medical examination without abnormal findings: Secondary | ICD-10-CM

## 2024-06-14 ENCOUNTER — Ambulatory Visit
Admission: RE | Admit: 2024-06-14 | Discharge: 2024-06-14 | Disposition: A | Discharge status: Home / Self Care | Source: Ambulatory Visit | Attending: Physician Assistant | Admitting: Physician Assistant

## 2024-06-14 DIAGNOSIS — Z0000 Encounter for general adult medical examination without abnormal findings: Secondary | ICD-10-CM

## 2024-06-22 ENCOUNTER — Other Ambulatory Visit: Payer: Self-pay | Admitting: Adult Health

## 2024-10-31 ENCOUNTER — Encounter: Payer: Self-pay | Admitting: Internal Medicine

## 2024-12-13 ENCOUNTER — Ambulatory Visit: Admitting: Physician Assistant
